# Patient Record
Sex: Female | Born: 1970
Health system: Southern US, Community
[De-identification: ages and names within clinical notes are randomized; demographics above are authoritative.]

## PROBLEM LIST (undated history)

## (undated) DIAGNOSIS — F419 Anxiety disorder, unspecified: Secondary | ICD-10-CM

## (undated) DIAGNOSIS — R011 Cardiac murmur, unspecified: Secondary | ICD-10-CM

## (undated) DIAGNOSIS — E119 Type 2 diabetes mellitus without complications: Secondary | ICD-10-CM

## (undated) DIAGNOSIS — K219 Gastro-esophageal reflux disease without esophagitis: Secondary | ICD-10-CM

## (undated) DIAGNOSIS — I10 Essential (primary) hypertension: Secondary | ICD-10-CM

## (undated) HISTORY — DX: Anxiety disorder, unspecified: F41.9

## (undated) HISTORY — PX: OOPHORECTOMY: SHX86

## (undated) HISTORY — PX: TUBAL LIGATION: SHX77

## (undated) HISTORY — PX: CHOLECYSTECTOMY: SHX55

## (undated) HISTORY — DX: Cardiac murmur, unspecified: R01.1

## (undated) HISTORY — PX: REFRACTIVE SURGERY: SHX103

## (undated) HISTORY — PX: KNEE SURGERY: SHX244

## (undated) HISTORY — DX: Type 2 diabetes mellitus without complications: E11.9

## (undated) HISTORY — DX: Essential (primary) hypertension: I10

---

## 2015-04-01 LAB — HM PAP SMEAR: HM Pap smear: NEGATIVE

## 2017-08-07 ENCOUNTER — Encounter: Payer: Self-pay | Admitting: Osteopathic Medicine

## 2017-08-07 ENCOUNTER — Ambulatory Visit (INDEPENDENT_AMBULATORY_CARE_PROVIDER_SITE_OTHER): Payer: 59 | Admitting: Osteopathic Medicine

## 2017-08-07 VITALS — BP 147/92 | HR 77 | Temp 98.1°F | Wt 197.1 lb

## 2017-08-07 DIAGNOSIS — I10 Essential (primary) hypertension: Secondary | ICD-10-CM

## 2017-08-07 DIAGNOSIS — Z131 Encounter for screening for diabetes mellitus: Secondary | ICD-10-CM

## 2017-08-07 DIAGNOSIS — Z13220 Encounter for screening for lipoid disorders: Secondary | ICD-10-CM

## 2017-08-07 DIAGNOSIS — K219 Gastro-esophageal reflux disease without esophagitis: Secondary | ICD-10-CM | POA: Diagnosis not present

## 2017-08-07 DIAGNOSIS — M1990 Unspecified osteoarthritis, unspecified site: Secondary | ICD-10-CM

## 2017-08-07 NOTE — Progress Notes (Addendum)
HPI: Monica Mcdaniel is a 47 y.o. female who  has no past medical history on file.  she presents to [facility] today, 08/07/17,  for chief complaint of: New to establish care Concerned about BP and DM2 risk   Pleasant new patient here to establish care   HTN: elevated BP today, concerned about high BP outside the office as well. No CP/SOB. Home cuff not verified.    Past medical, surgical, social and family history reviewed:  There are no active problems to display for this patient.  Past Surgical History:  Procedure Laterality Date  . CHOLECYSTECTOMY    . OOPHORECTOMY    . REFRACTIVE SURGERY     Social History   Tobacco Use  . Smoking status: Former Smoker    Packs/day: 1.00    Years: 7.00    Pack years: 7.00    Last attempt to quit: 1997    Years since quitting: 22.4  . Smokeless tobacco: Former Engineer, waterUser  Substance Use Topics  . Alcohol use: Yes    Comment: 3-4/wk   Family History  Problem Relation Age of Onset  . High blood pressure Mother   . Diabetes Mother   . Lung cancer Mother   . Diabetes Maternal Grandmother   . Stroke Maternal Grandmother      Current medication list and allergy/intolerance information reviewed:    Current Outpatient Medications  Medication Sig Dispense Refill  . naproxen sodium (ALEVE) 220 MG tablet Take 220 mg by mouth.    . ranitidine (ZANTAC) 150 MG tablet Take 150 mg by mouth 2 (two) times daily.     No current facility-administered medications for this visit.     No Known Allergies    Review of Systems:  Constitutional:  No  fever, no chills, No recent illness, No unintentional weight changes. No significant fatigue.   HEENT: No  headache, no vision change, no hearing change, No sore throat, No  sinus pressure  Cardiac: No  chest pain, No  pressure, No palpitations, No  Orthopnea  Respiratory:  No  shortness of breath. No  Cough  Gastrointestinal: No  abdominal pain, No  nausea, No   vomiting,  No  blood in stool, No  diarrhea, No  constipation   Musculoskeletal: No new myalgia/arthralgia  Skin: No  Rash, No other wounds/concerning lesions  Genitourinary: No  incontinence, No  abnormal genital bleeding, No abnormal genital discharge  Hem/Onc: No  easy bruising/bleeding, No  abnormal lymph node  Endocrine: No cold intolerance,  No heat intolerance. No polyuria/polydipsia/polyphagia   Neurologic: No  weakness, No  dizziness, No  slurred speech/focal weakness/facial droop  Psychiatric: No  concerns with depression, No  concerns with anxiety, No sleep problems, No mood problems  Exam:  BP (!) 147/92 (BP Location: Left Arm, Patient Position: Sitting, Cuff Size: Large)   Pulse 77   Temp 98.1 F (36.7 C) (Oral)   Wt 197 lb 1.6 oz (89.4 kg)   LMP 07/29/2017   Constitutional: VS see above. General Appearance: alert, well-developed, well-nourished, NAD  Eyes: Normal lids and conjunctive, non-icteric sclera  Ears, Nose, Mouth, Throat: MMM, Normal external inspection ears/nares/mouth/lips/gums. TM normal bilaterally. Pharynx/tonsils no erythema, no exudate. Nasal mucosa normal.   Neck: No masses, trachea midline. No thyroid enlargement. No tenderness/mass appreciated. No lymphadenopathy  Respiratory: Normal respiratory effort. no wheeze, no rhonchi, no rales  Cardiovascular: S1/S2 normal, no murmur, no rub/gallop auscultated. RRR. No lower extremity edema.  Gastrointestinal: Nontender, no masses.  No hepatomegaly, no splenomegaly. No hernia appreciated. Bowel sounds normal. Rectal exam deferred.   Musculoskeletal: Gait normal. No clubbing/cyanosis of digits.   Neurological: Normal balance/coordination. No tremor.   Skin: warm, dry, intact.  Psychiatric: Normal judgment/insight. Normal mood and affect. Oriented x3.     ASSESSMENT/PLAN:   Essential hypertension - Plan: CBC, COMPLETE METABOLIC PANEL WITH GFR, Lipid panel, TSH, Hemoglobin A1C w/out eAG, TEST  AUTHORIZATION, hydrochlorothiazide (HYDRODIURIL) 12.5 MG tablet  Lipid screening - Plan: Lipid panel  Diabetes mellitus screening - Plan: COMPLETE METABOLIC PANEL WITH GFR, Hemoglobin A1C w/out eAG, TEST AUTHORIZATION  Gastroesophageal reflux disease, esophagitis presence not specified - Plan: ranitidine (ZANTAC) 150 MG tablet  Arthritis - Plan: naproxen sodium (ALEVE) 220 MG tablet    Patient Instructions  Plan: Get labs done - fasting Will start BP meds based on lab results Recheck BP in the office 1-2 weeks or so after starting meds  Bring home BP monitor to the office at that visit       Visit summary with medication list and pertinent instructions was printed for patient to review. All questions at time of visit were answered - patient instructed to contact office with any additional concerns. ER/RTC precautions were reviewed with the patient.   Follow-up plan: Return for recheck blood pressure 1-2 weeks after starting medicine .   Please note: voice recognition software was used to produce this document, and typos may escape review. Please contact Dr. Lyn Hollingshead for any needed clarifications.

## 2017-08-07 NOTE — Patient Instructions (Addendum)
Plan: Get labs done - fasting Will start BP meds based on lab results Recheck BP in the office 1-2 weeks or so after starting meds  Bring home BP monitor to the office at that visit

## 2017-08-14 LAB — COMPLETE METABOLIC PANEL WITH GFR
AG Ratio: 1.5 (calc) (ref 1.0–2.5)
ALBUMIN MSPROF: 4.4 g/dL (ref 3.6–5.1)
ALKALINE PHOSPHATASE (APISO): 79 U/L (ref 33–115)
ALT: 26 U/L (ref 6–29)
AST: 21 U/L (ref 10–35)
BILIRUBIN TOTAL: 0.6 mg/dL (ref 0.2–1.2)
BUN: 11 mg/dL (ref 7–25)
CHLORIDE: 102 mmol/L (ref 98–110)
CO2: 26 mmol/L (ref 20–32)
Calcium: 9.7 mg/dL (ref 8.6–10.2)
Creat: 0.81 mg/dL (ref 0.50–1.10)
GFR, Est African American: 100 mL/min/{1.73_m2} (ref >=60)
GFR, Est Non African American: 86 mL/min/{1.73_m2} (ref >=60)
GLUCOSE: 105 mg/dL — AB (ref 65–99)
Globulin: 2.9 g/dL (calc) (ref 1.9–3.7)
Potassium: 4.7 mmol/L (ref 3.5–5.3)
SODIUM: 135 mmol/L (ref 135–146)
Total Protein: 7.3 g/dL (ref 6.1–8.1)

## 2017-08-14 LAB — TEST AUTHORIZATION

## 2017-08-14 LAB — TSH: TSH: 1.9 mIU/L

## 2017-08-14 LAB — LIPID PANEL
CHOLESTEROL: 159 mg/dL (ref <200)
HDL: 53 mg/dL (ref >50)
LDL Cholesterol (Calc): 91 mg/dL (calc)
Non-HDL Cholesterol (Calc): 106 mg/dL (calc) (ref <130)
Total CHOL/HDL Ratio: 3 (calc) (ref <5.0)
Triglycerides: 67 mg/dL (ref <150)

## 2017-08-14 LAB — CBC
HCT: 41.1 % (ref 35.0–45.0)
HEMOGLOBIN: 13.3 g/dL (ref 11.7–15.5)
MCH: 27.7 pg (ref 27.0–33.0)
MCHC: 32.4 g/dL (ref 32.0–36.0)
MCV: 85.6 fL (ref 80.0–100.0)
MPV: 10.3 fL (ref 7.5–12.5)
Platelets: 248 10*3/uL (ref 140–400)
RBC: 4.8 10*6/uL (ref 3.80–5.10)
RDW: 12.8 % (ref 11.0–15.0)
WBC: 5.3 10*3/uL (ref 3.8–10.8)

## 2017-08-14 LAB — HEMOGLOBIN A1C W/OUT EAG: HEMOGLOBIN A1C: 5 %{Hb} (ref <5.7)

## 2017-08-15 MED ORDER — HYDROCHLOROTHIAZIDE 12.5 MG PO TABS: 12.5000 mg | ORAL_TABLET | Freq: Every day | ORAL | 0 refills | Status: DC

## 2017-08-15 NOTE — Addendum Note (Signed)
Addended by: Deirdre PippinsALEXANDER, Kentrell Guettler M on: 08/15/2017 09:27 AM   Modules accepted: Orders

## 2017-08-24 ENCOUNTER — Telehealth: Payer: Self-pay | Admitting: Osteopathic Medicine

## 2017-08-24 NOTE — Telephone Encounter (Signed)
Good morning,   Patient called this morning to cancel her appt. on the 15th. She wanted to reschedule, however, with you being booked, I was unable to find a time that fit with her schedule as well. I was wondering if she could be scheduled a nurse visit while you were here in the office?

## 2017-08-24 NOTE — Telephone Encounter (Signed)
That's fine - be sure to remind her to bring home BP monitor to that visit w/ the nurse

## 2017-08-28 ENCOUNTER — Ambulatory Visit: Payer: Self-pay | Admitting: Osteopathic Medicine

## 2017-08-31 ENCOUNTER — Ambulatory Visit (INDEPENDENT_AMBULATORY_CARE_PROVIDER_SITE_OTHER): Payer: BLUE CROSS/BLUE SHIELD | Admitting: Physician Assistant

## 2017-08-31 VITALS — BP 139/71 | HR 74

## 2017-08-31 DIAGNOSIS — I10 Essential (primary) hypertension: Secondary | ICD-10-CM | POA: Diagnosis not present

## 2017-08-31 NOTE — Progress Notes (Signed)
Pt came into clinic today for BP check, she also brought her home machine. Pt reports taking her Rx's every am, but she has only been doing her home BP checks in the evening after getting off work. Our reading today was 139/71 (pulse 74), her home machine read 134/77 (pulse 77). It was very accurate.   Some of her home BP readings (pulse in parenthesis) were: 132/80 (74) - today 134/77 (77) 127/77 (69) 137/86 (76) 141/85 (82) 134/88 (86) 148/90 (81) - 08/19/17  Went over readings with Provider in office. Advised to keep medications the same, and start checking her BP in the AM/PM. BP log sheet provided. Pt will send readings in 2 weeks via MyChart for Provider review. No further questions/concerns.

## 2017-09-14 ENCOUNTER — Encounter: Payer: Self-pay | Admitting: Osteopathic Medicine

## 2017-09-14 DIAGNOSIS — I10 Essential (primary) hypertension: Secondary | ICD-10-CM

## 2017-09-15 MED ORDER — HYDROCHLOROTHIAZIDE 12.5 MG PO TABS: 25.0000 mg | ORAL_TABLET | Freq: Every day | ORAL | 0 refills | Status: DC

## 2017-10-10 ENCOUNTER — Encounter: Payer: Self-pay | Admitting: Osteopathic Medicine

## 2017-10-10 DIAGNOSIS — I10 Essential (primary) hypertension: Secondary | ICD-10-CM | POA: Insufficient documentation

## 2017-10-17 ENCOUNTER — Encounter: Payer: Self-pay | Admitting: Osteopathic Medicine

## 2017-10-17 DIAGNOSIS — I10 Essential (primary) hypertension: Secondary | ICD-10-CM

## 2017-10-18 MED ORDER — BENAZEPRIL HCL 10 MG PO TABS: 10.0000 mg | ORAL_TABLET | Freq: Every day | ORAL | 1 refills | Status: DC

## 2017-10-18 MED ORDER — HYDROCHLOROTHIAZIDE 25 MG PO TABS: 25.0000 mg | ORAL_TABLET | Freq: Every day | ORAL | 1 refills | Status: DC

## 2017-10-19 ENCOUNTER — Encounter: Payer: Self-pay | Admitting: Osteopathic Medicine

## 2017-10-31 ENCOUNTER — Encounter: Payer: Self-pay | Admitting: Osteopathic Medicine

## 2017-10-31 ENCOUNTER — Ambulatory Visit: Payer: BLUE CROSS/BLUE SHIELD | Admitting: Osteopathic Medicine

## 2017-10-31 DIAGNOSIS — I10 Essential (primary) hypertension: Secondary | ICD-10-CM

## 2017-10-31 MED ORDER — HYDROCHLOROTHIAZIDE 25 MG PO TABS: 25.0000 mg | ORAL_TABLET | Freq: Every day | ORAL | 1 refills | Status: DC

## 2017-10-31 MED ORDER — BENAZEPRIL HCL 10 MG PO TABS: 10.0000 mg | ORAL_TABLET | Freq: Every day | ORAL | 1 refills | Status: DC

## 2017-10-31 NOTE — Progress Notes (Signed)
HPI: Monica Mcdaniel is a 47 y.o. female who  has no past medical history on file.  she presents to [facility] today, 10/31/17,  for chief complaint of:  Blood Pressure follow-up  We initiated hydrochlorothiazide 12.5 mg 08/07/2017.  Patient had her home blood pressure cuff verified in the office 08/31/2017.  We subsequently increased HCTZ 25 mg daily, this was still not really controlling blood pressure so added benazepril on 10/17/2017 advised patient to follow-up in the office.  She is here today for blood pressure recheck.  Home readings:  Systolic: 110s-120s, 140 occ Diastolic: 60s-80s    Past medical history, surgical history, and family history reviewed.  Current medication list and allergy/intolerance information reviewed.   (See remainder of HPI, ROS, Phys Exam below)        ASSESSMENT/PLAN:   Essential hypertension - Plan: hydrochlorothiazide (HYDRODIURIL) 25 MG tablet, BASIC METABOLIC PANEL WITH GFR   Offered change meds d/t cough, pt not bothered by this and ok to continue meds, advised call me if needed and we can change!   Meds ordered this encounter  Medications  . benazepril (LOTENSIN) 10 MG tablet    Sig: Take 1 tablet (10 mg total) by mouth daily.    Dispense:  90 tablet    Refill:  1  . hydrochlorothiazide (HYDRODIURIL) 25 MG tablet    Sig: Take 1 tablet (25 mg total) by mouth daily.    Dispense:  90 tablet    Refill:  1     Follow-up plan: Return in about 6 months (around 05/01/2018) for BP check/annual wellness physical, sooner if needed.          ############################################ ############################################ ############################################ ############################################    Outpatient Encounter Medications as of 10/31/2017  Medication Sig  . benazepril (LOTENSIN) 10 MG tablet Take 1 tablet (10 mg total) by mouth daily.  . hydrochlorothiazide  (HYDRODIURIL) 25 MG tablet Take 1 tablet (25 mg total) by mouth daily.  . naproxen sodium (ALEVE) 220 MG tablet Take 220 mg by mouth.  . ranitidine (ZANTAC) 150 MG tablet Take 150 mg by mouth 2 (two) times daily.   No facility-administered encounter medications on file as of 10/31/2017.    No Known Allergies    Review of Systems:  Constitutional: No recent illness  HEENT: No  headache, no vision change  Cardiac: No  chest pain, No  pressure, No palpitations  Respiratory:  No  shortness of breath. +occasional nonbothersome Cough  Gastrointestinal: No  abdominal pain  Musculoskeletal: No new myalgia/arthralgia  Neurologic: No  weakness, No  Dizziness  Psychiatric: No  concerns with depression, +concerns with anxiety - work stress  Exam:  BP 139/73 (BP Location: Left Arm, Patient Position: Sitting, Cuff Size: Normal)   Pulse 87   Temp 98.4 F (36.9 C) (Oral)   Wt 180 lb 14.4 oz (82.1 kg)   Constitutional: VS see above. General Appearance: alert, well-developed, well-nourished, NAD  Eyes: Normal lids and conjunctive, non-icteric sclera  Ears, Nose, Mouth, Throat: MMM, Normal external inspection ears/nares/mouth/lips/gums.  Neck: No masses, trachea midline.   Respiratory: Normal respiratory effort. no wheeze, no rhonchi, no rales  Cardiovascular: S1/S2 normal, no murmur, no rub/gallop auscultated. RRR.   Musculoskeletal: Gait normal. Symmetric and independent movement of all extremities  Neurological: Normal balance/coordination. No tremor.  Skin: warm, dry, intact.   Psychiatric: Normal judgment/insight. Normal mood and affect. Oriented x3.   Visit summary with medication list and pertinent instructions was printed for patient to  review, advised to alert [place] if any changes needed. All questions at time of visit were answered - patient instructed to contact office with any additional concerns. ER/RTC precautions were reviewed with the patient and understanding  verbalized.   Follow-up plan: Return in about 6 months (around 05/01/2018) for BP check/annual wellness physical, sooner if needed.    Please note: voice recognition software was used to produce this document, and typos may escape review. Please contact Dr. Lyn Hollingshead for any needed clarifications.

## 2017-11-07 ENCOUNTER — Other Ambulatory Visit: Payer: Self-pay | Admitting: Osteopathic Medicine

## 2017-11-07 DIAGNOSIS — I10 Essential (primary) hypertension: Secondary | ICD-10-CM

## 2017-11-07 LAB — BASIC METABOLIC PANEL WITH GFR
BUN: 13 mg/dL (ref 7–25)
CO2: 26 mmol/L (ref 20–32)
CREATININE: 0.85 mg/dL (ref 0.50–1.10)
Calcium: 9.9 mg/dL (ref 8.6–10.2)
Chloride: 101 mmol/L (ref 98–110)
GFR, EST AFRICAN AMERICAN: 95 mL/min/{1.73_m2} (ref >=60)
GFR, Est Non African American: 82 mL/min/{1.73_m2} (ref >=60)
GLUCOSE: 121 mg/dL — AB (ref 65–99)
Potassium: 4.1 mmol/L (ref 3.5–5.3)
SODIUM: 137 mmol/L (ref 135–146)

## 2017-11-08 ENCOUNTER — Encounter: Payer: Self-pay | Admitting: Physician Assistant

## 2017-11-08 DIAGNOSIS — R739 Hyperglycemia, unspecified: Secondary | ICD-10-CM | POA: Insufficient documentation

## 2018-02-11 ENCOUNTER — Emergency Department
Admission: EM | Admit: 2018-02-11 | Discharge: 2018-02-11 | Disposition: A | Discharge status: Home / Self Care | Payer: BLUE CROSS/BLUE SHIELD | Source: Home / Self Care | Attending: Family Medicine | Admitting: Family Medicine

## 2018-02-11 ENCOUNTER — Emergency Department (INDEPENDENT_AMBULATORY_CARE_PROVIDER_SITE_OTHER): Payer: BLUE CROSS/BLUE SHIELD

## 2018-02-11 DIAGNOSIS — R05 Cough: Secondary | ICD-10-CM | POA: Diagnosis not present

## 2018-02-11 DIAGNOSIS — R053 Chronic cough: Secondary | ICD-10-CM

## 2018-02-11 MED ORDER — PREDNISONE 20 MG PO TABS: ORAL_TABLET | ORAL | 0 refills | Status: DC

## 2018-02-11 MED ORDER — AZITHROMYCIN 250 MG PO TABS: ORAL_TABLET | ORAL | 0 refills | Status: DC

## 2018-02-11 NOTE — Discharge Instructions (Addendum)
Take plain guaifenesin (1200mg extended release tabs such as Mucinex) twice daily, with plenty of water, for cough and congestion.   Get adequate rest.   °May use Afrin nasal spray (or generic oxymetazoline) each morning for about 5 days and then discontinue.  Also recommend using saline nasal spray several times daily and saline nasal irrigation (AYR is a common brand).  Use Flonase nasal spray each morning after using Afrin nasal spray and saline nasal irrigation. °Try warm salt water gargles for sore throat.  °Stop all antihistamines for now, and other non-prescription cough/cold preparations. °May take Delsym Cough Suppressant at bedtime for nighttime cough.  °  °

## 2018-02-11 NOTE — ED Provider Notes (Signed)
[facility]    CSN: [id number] Arrival date & time: 02/11/18  1350     History   Chief Complaint Chief Complaint  Patient presents with  . Cough    HPI Monica Mcdaniel is a 47 y.o. female.   Patient reports that she developed a cold-like illness about 3.5 weeks ago, including sinus congestion and cough, although she does not feel ill and denies fevers, chills, and sweats.  She was started on Lotensin 3 months ago, and shortly afterwards developed a very mild cough that has persisted.     Past medical history negative  Patient Active Problem List   Diagnosis Date Noted  . Hyperglycemia 11/08/2017  . Hypertension 10/10/2017    Past Surgical History:  Procedure Laterality Date  . CHOLECYSTECTOMY    . OOPHORECTOMY    . REFRACTIVE SURGERY      OB History   No obstetric history on file.      Home Medications    Prior to Admission medications   Medication Sig Start Date End Date Taking? Authorizing Provider  benazepril (LOTENSIN) 10 MG tablet Take 1 tablet (10 mg total) by mouth daily. 10/31/17  Yes Sunnie NielsenAlexander, Natalie, DO  famotidine (PEPCID) 20 MG tablet Take 20 mg by mouth 2 (two) times daily.   Yes [provider]  hydrochlorothiazide (HYDRODIURIL) 25 MG tablet Take 1 tablet (25 mg total) by mouth daily. 10/31/17  Yes Sunnie NielsenAlexander, Natalie, DO  azithromycin (ZITHROMAX Z-PAK) 250 MG tablet Take 2 tabs today; then begin one tab once daily for 4 more days. 02/11/18   Lattie HawBeese, Stephen A, MD  naproxen sodium (ALEVE) 220 MG tablet Take 220 mg by mouth.    [provider]  predniSONE (DELTASONE) 20 MG tablet Take one tab by mouth twice daily for 4 days, then one daily. Take with food. 02/11/18   Lattie HawBeese, Stephen A, MD  ranitidine (ZANTAC) 150 MG tablet Take 150 mg by mouth 2 (two) times daily.    [provider]    Family History Family History  Problem Relation Age of Onset  . High blood pressure Mother   . Diabetes Mother   . Lung cancer  Mother   . Diabetes Maternal Grandmother   . Stroke Maternal Grandmother     Social History Social History   Tobacco Use  . Smoking status: Former Smoker    Packs/day: 1.00    Years: 7.00    Pack years: 7.00    Last attempt to quit: 1997    Years since quitting: 23.0  . Smokeless tobacco: Former Engineer, waterUser  Substance Use Topics  . Alcohol use: Yes    Comment: 3-4/wk  . Drug use: Never     Allergies   Patient has no known allergies.   Review of Systems Review of Systems + sore throat, resolved + cough No pleuritic pain No wheezing + nasal congestion + post-nasal drainage No sinus pain/pressure No itchy/red eyes No earache No hemoptysis No SOB No fever/chills No nausea No vomiting No abdominal pain No diarrhea No urinary symptoms No skin rash No fatigue No myalgias No headache    Physical Exam Triage Vital Signs ED Triage Vitals  Enc Vitals Group     BP 02/11/18 1419 (!) 145/92     Pulse --      Resp 02/11/18 1419 17     Temp 02/11/18 1419 98.4 F (36.9 C)     Temp Source 02/11/18 1419 Oral     SpO2 02/11/18 1419 99 %  Weight 02/11/18 1420 186 lb (84.4 kg)     Height 02/11/18 1420 5\' 1"  (1.549 m)     Head Circumference --      Peak Flow --      Pain Score 02/11/18 1420 0     Pain Loc --      Pain Edu? --      Excl. in GC? --    No data found.  Updated Vital Signs BP (!) 145/92 (BP Location: Right Arm)   Temp 98.4 F (36.9 C) (Oral)   Resp 17   Ht 5\' 1"  (1.549 m)   Wt 84.4 kg   LMP 02/11/2018 (Exact Date)   SpO2 99%   BMI 35.14 kg/m   Visual Acuity Right Eye Distance:   Left Eye Distance:   Bilateral Distance:    Right Eye Near:   Left Eye Near:    Bilateral Near:     Physical Exam Nursing notes and Vital Signs reviewed. Appearance:  Patient appears stated age, and in no acute distress Eyes:  Pupils are equal, round, and reactive to light and accomodation.  Extraocular movement is intact.  Conjunctivae are not inflamed    Ears:  Canals normal.  Tympanic membranes normal.  Nose:  Mildly congested turbinates.  No sinus tenderness.   Pharynx:  Normal Neck:  Supple.  Enlarged nontender posterior/lateral nodes are palpated bilaterally.  Lungs:  Clear to auscultation.  Breath sounds are equal.  Moving air well Heart:  Regular rate and rhythm without murmurs, rubs, or gallops.  Abdomen:  Nontender without masses or hepatosplenomegaly.  Bowel sounds are present.  No CVA or flank tenderness.  Extremities:  No edema.  Skin:  No rash present.    UC Treatments / Results  Labs (all labs ordered are listed, but only abnormal results are displayed) Labs Reviewed - No data to display  EKG None  Radiology Dg Chest 2 View  Result Date: 02/11/2018 CLINICAL DATA:  Persistent cough for 1 month. EXAM: CHEST - 2 VIEW COMPARISON:  None. FINDINGS: The heart size and mediastinal contours are within normal limits. Both lungs are clear. The visualized skeletal structures are unremarkable. IMPRESSION: Normal exam. Electronically Signed   By: Francene BoyersJames  Maxwell M.D.   On: 02/11/2018 15:37    Procedures Procedures (including critical care time)  Medications Ordered in UC Medications - No data to display  Initial Impression / Assessment and Plan / UC Course  I have reviewed the triage vital signs and the nursing notes.  Pertinent labs & imaging results that were available during my care of the patient were reviewed by me and considered in my medical decision making (see chart for details).    ?bronchitis, superimposed on ?ACEI cough. Begin empiric Z-pack and prednisone burst/taper. Followup with Family Doctor if not improved in about two weeks (if cough persists, may need to switch to ARB)   Final Clinical Impressions(s) / UC Diagnoses   Final diagnoses:  Persistent cough for 3 weeks or longer     Discharge Instructions     Take plain guaifenesin (1200mg  extended release tabs such as Mucinex) twice daily, with plenty  of water, for cough and congestion.   Get adequate rest.   May use Afrin nasal spray (or generic oxymetazoline) each morning for about 5 days and then discontinue.  Also recommend using saline nasal spray several times daily and saline nasal irrigation (AYR is a common brand).  Use Flonase nasal spray each morning after using Afrin nasal spray and  saline nasal irrigation. Try warm salt water gargles for sore throat.  Stop all antihistamines for now, and other non-prescription cough/cold preparations. May take Delsym Cough Suppressant at bedtime for nighttime cough.         ED Prescriptions    Medication Sig Dispense Auth. Provider   predniSONE (DELTASONE) 20 MG tablet Take one tab by mouth twice daily for 4 days, then one daily. Take with food. 12 tablet Lattie Haw, MD   azithromycin (ZITHROMAX Z-PAK) 250 MG tablet Take 2 tabs today; then begin one tab once daily for 4 more days. 6 tablet Lattie Haw, MD        Lattie Haw, MD 02/16/18 [id number]

## 2018-02-11 NOTE — ED Triage Notes (Signed)
Any complains of a cough for 3 1/2 weeks. Cough has become productive in the morning over the last week with green sputum. She also has notice more congestion and runny nose with green drainage. Denies fever, chills or sweats

## 2018-03-12 ENCOUNTER — Ambulatory Visit: Payer: BLUE CROSS/BLUE SHIELD | Admitting: Osteopathic Medicine

## 2018-03-12 ENCOUNTER — Encounter: Payer: Self-pay | Admitting: Osteopathic Medicine

## 2018-03-12 VITALS — BP 122/80 | HR 87 | Temp 98.1°F | Wt 189.8 lb

## 2018-03-12 DIAGNOSIS — R05 Cough: Secondary | ICD-10-CM

## 2018-03-12 DIAGNOSIS — R053 Chronic cough: Secondary | ICD-10-CM

## 2018-03-12 MED ORDER — PREDNISONE 10 MG (48) PO TBPK: ORAL_TABLET | Freq: Every day | ORAL | 0 refills | Status: DC

## 2018-03-12 MED ORDER — BUDESONIDE-FORMOTEROL FUMARATE 160-4.5 MCG/ACT IN AERO: 2.0000 | INHALATION_SPRAY | Freq: Two times a day (BID) | RESPIRATORY_TRACT | 3 refills | Status: DC

## 2018-03-12 MED ORDER — HYDROCODONE-HOMATROPINE 5-1.5 MG/5ML PO SYRP: 5.0000 mL | ORAL_SOLUTION | ORAL | 0 refills | Status: DC | PRN

## 2018-03-12 MED ORDER — BENZONATATE 200 MG PO CAPS: 200.0000 mg | ORAL_CAPSULE | Freq: Three times a day (TID) | ORAL | 0 refills | Status: DC | PRN

## 2018-03-12 NOTE — Patient Instructions (Addendum)
Plan:  Will try the following for cough.. Inhaler (symbicort) Steroids (prednisone, longer taper this time) Daytime cough medicine (benzonatate aka Tessalon) Cough Syrup at nighttime [facility])   Consider also... Pepcid every night for a couple weeks Adding allergy medicine: Claritin or Zyrtec or Allegra pills +/- Flonase or Nasonex nasal sprays

## 2018-03-12 NOTE — Progress Notes (Signed)
HPI: Monica Mcdaniel is a 49 y.o. female who  has no past medical history on file.  she presents to [facility] today, 03/12/18,  for chief complaint of: Cough  . Context: started after viral cold, got a bit better then cough has just persisted, went to urgent care CXR ok, doesn't feel like cough she previously had w/ ACE Rx.  . Location: chest . Quality: congestion, cough . Duration: 2+ mos . Timing: constant  . Modifying factors: no home meds at this point, steroid burst helped . Assoc signs/symptoms: mild nasal congestion w/ clear mucus        Past medical, surgical, social and family history reviewed and updated as necessary.   Current medication list and allergy/intolerance information reviewed:    Current Meds  Medication Sig  . benazepril (LOTENSIN) 10 MG tablet Take 1 tablet (10 mg total) by mouth daily.  . famotidine (PEPCID) 20 MG tablet Take 20 mg by mouth 2 (two) times daily.  . hydrochlorothiazide (HYDRODIURIL) 25 MG tablet Take 1 tablet (25 mg total) by mouth daily.  . naproxen sodium (ALEVE) 220 MG tablet Take 220 mg by mouth.  . ranitidine (ZANTAC) 150 MG tablet Take 150 mg by mouth 2 (two) times daily.  . [DISCONTINUED] predniSONE (DELTASONE) 20 MG tablet Take one tab by mouth twice daily for 4 days, then one daily. Take with food.     No Known Allergies    Review of Systems:  Constitutional:  No  fever, no chills, No recent illness, No unintentional weight changes. No significant fatigue.   HEENT: No  headache, no vision change, no hearing change, No sore throat, No  sinus pressure  Cardiac: No  chest pain, No  pressure, No palpitations  Respiratory:  No  shortness of breath. +Cough  Gastrointestinal: No  abdominal pain, No  nausea, No  vomiting,  No  blood in stool, No  diarrhea  Musculoskeletal: No new myalgia/arthralgia  Skin: No  Rash  Neurologic: No  weakness, No  dizziness  Exam:  BP 122/80 (BP Location:  Left Arm, Patient Position: Sitting, Cuff Size: Normal)   Pulse 87   Temp 98.1 F (36.7 C) (Oral)   Wt 189 lb 12.8 oz (86.1 kg)   LMP 02/11/2018 (Exact Date)   SpO2 100%   BMI 35.86 kg/m   Constitutional: VS see above. General Appearance: alert, well-developed, well-nourished, NAD  Eyes: Normal lids and conjunctive, non-icteric sclera  Ears, Nose, Mouth, Throat: MMM, Normal external inspection ears/nares/mouth/lips/gums. TM normal bilaterally. Pharynx/tonsils no erythema, no exudate. Nasal mucosa normal.   Neck: No masses, trachea midline. No tenderness/mass appreciated. No lymphadenopathy  Respiratory: Normal respiratory effort. no wheeze, no rhonchi, no rales  Cardiovascular: S1/S2 normal, no murmur, no rub/gallop auscultated. RRR. No lower extremity edema.   Gastrointestinal: Nontender, no masses. Bowel sounds normal.  Musculoskeletal: Gait normal.   Neurological: Normal balance/coordination. No tremor.   Skin: warm, dry, intact.   Psychiatric: Normal judgment/insight. Normal mood and affect.      ASSESSMENT/PLAN: The encounter diagnosis was Persistent cough.  Meds ordered this encounter  Medications  . predniSONE (STERAPRED UNI-PAK 48 TAB) 10 MG (48) TBPK tablet    Sig: Take by mouth daily. 12-Day taper, po    Dispense:  48 tablet    Refill:  0  . benzonatate (TESSALON) 200 MG capsule    Sig: Take 1 capsule (200 mg total) by mouth 3 (three) times daily as needed for cough.  Dispense:  30 capsule    Refill:  0  . HYDROcodone-homatropine (HYCODAN) 5-1.5 MG/5ML syrup    Sig: Take 5 mLs by mouth every 4 (four) hours as needed for cough.    Dispense:  120 mL    Refill:  0  . budesonide-formoterol (SYMBICORT) 160-4.5 MCG/ACT inhaler    Sig: Inhale 2 puffs into the lungs 2 (two) times daily.    Dispense:  1 Inhaler    Refill:  3    No orders of the defined types were placed in this encounter.   Patient Instructions  Plan:  Will try the following for  cough.. Inhaler (symbicort) Steroids (prednisone, longer taper this time) Daytime cough medicine (benzonatate aka Tessalon) Cough Syrup at nighttime [facility])   Consider also... Pepcid every night for a couple weeks Adding allergy medicine: Claritin or Zyrtec or Allegra pills +/- Flonase or Nasonex nasal sprays             Visit summary with medication list and pertinent instructions was printed for patient to review. All questions at time of visit were answered - patient instructed to contact office with any additional concerns or updates. ER/RTC precautions were reviewed with the patient.   Follow-up plan: Return if symptoms worsen or fail to improve.    Please note: voice recognition software was used to produce this document, and typos may escape review. Please contact Dr. Lyn Hollingshead for any needed clarifications.

## 2018-05-01 ENCOUNTER — Ambulatory Visit (INDEPENDENT_AMBULATORY_CARE_PROVIDER_SITE_OTHER): Payer: BLUE CROSS/BLUE SHIELD | Admitting: Osteopathic Medicine

## 2018-05-01 ENCOUNTER — Other Ambulatory Visit: Payer: Self-pay

## 2018-05-01 ENCOUNTER — Other Ambulatory Visit: Payer: Self-pay | Admitting: Osteopathic Medicine

## 2018-05-01 ENCOUNTER — Encounter: Payer: Self-pay | Admitting: Osteopathic Medicine

## 2018-05-01 VITALS — BP 122/76 | HR 97 | Temp 98.5°F | Wt 189.3 lb

## 2018-05-01 DIAGNOSIS — K219 Gastro-esophageal reflux disease without esophagitis: Secondary | ICD-10-CM | POA: Diagnosis not present

## 2018-05-01 DIAGNOSIS — Z0000 Encounter for general adult medical examination without abnormal findings: Secondary | ICD-10-CM | POA: Diagnosis not present

## 2018-05-01 DIAGNOSIS — R05 Cough: Secondary | ICD-10-CM | POA: Diagnosis not present

## 2018-05-01 DIAGNOSIS — I10 Essential (primary) hypertension: Secondary | ICD-10-CM | POA: Diagnosis not present

## 2018-05-01 DIAGNOSIS — Z1239 Encounter for other screening for malignant neoplasm of breast: Secondary | ICD-10-CM | POA: Diagnosis not present

## 2018-05-01 DIAGNOSIS — R053 Chronic cough: Secondary | ICD-10-CM

## 2018-05-01 MED ORDER — PANTOPRAZOLE SODIUM 40 MG PO TBEC: 40.0000 mg | DELAYED_RELEASE_TABLET | Freq: Every day | ORAL | 0 refills | Status: DC

## 2018-05-01 NOTE — Patient Instructions (Addendum)
General Preventive Care  Most recent routine screening lipids/other labs: ordered today   Everyone should have blood pressure checked once per year.   Tobacco: don't!   Alcohol: responsible moderation is ok for most adults - if you have concerns about your alcohol intake, please talk to me!   Exercise: as tolerated to reduce risk of cardiovascular disease and diabetes. Strength training will also prevent osteoporosis.   Mental health: if need for mental health care (medicines, counseling, other), or concerns about moods, please let me know!   Sexual health: if ever need for STD testing, or if concerns with libido/pain problems, please let me know!   Advanced Directive: Living Will and/or Healthcare Power of Attorney recommended for all adults, regardless of age or health. See attached info! Vaccines  Flu vaccine: recommended for almost everyone, late fall/early winter.   Shingles vaccine: Shingrix recommended after age 46.  Pneumonia vaccines: Prevnar and Pneumovax recommended after age 65, or sooner if certain medical conditions.  Tetanus booster: Tdap recommended every 10 years. Due 09/2024 Cancer screenings   Colon cancer screening: recommended for everyone at age 51, but some folks need a colonoscopy sooner if risk factors.  Breast cancer screening: mammogram recommended at age 17 every other year at least, and annually after age 39. Mammogram ordered.   Cervical cancer screening: Pap every 1 to 5 years depending on age and other risk factors. Can usually stop at age 77 or w/ hysterectomy. Please ask your OBGYN to forward [place] records of your pap results!   Lung cancer screening: not needed w/ remote history of smoking  Infection screenings . HIV, Gonorrhea/Chlamydia: screening as needed . Hepatitis C: recommended for anyone born 55-1965 . TB: certain at-risk populations, or depending on work requirements and/or travel history Other . Bone Density Test: recommended for  women at age 36    COUGH:  I think cough is most likely due to acid reflux causing irritation of the airways, particularly with evening/nighttime cough, lying down flat.  Can continue Pepcid, I also sent a medication called pantoprazole to take for the next 6 to 8 weeks, then can try coming off of it and see how you are doing.  Can also treat allergies/postnasal drip with over-the-counter antihistamine such as Claritin, Allegra, Zyrtec plus over-the-counter nasal steroid spray such as Flonase, Nasonex.  If cough is not getting better with these measures, please let me know.  May need to send to GI or pulmonary specialist for further evaluation.       Please note: Preventive care issues were addressed today as part of your annual wellness physical, and this care should be covered under your insurance. However there were other medical issues which were also addressed today, and insurance may bill you separately for a "problem-based visit" for this care: cough, hypertension. Any questions or concerns about charges which may appear on your billing statement should be directed to your insurance company or to [facility] billing department, and they will contact our office if there are further concerns.

## 2018-05-01 NOTE — Telephone Encounter (Signed)
[facility] requesting pantoprazole be changed to omeprazole. Pls advise, thanks.

## 2018-05-01 NOTE — Telephone Encounter (Signed)
Noted  

## 2018-05-01 NOTE — Progress Notes (Signed)
HPI: Monica Mcdaniel is a 48 y.o. female who  has no past medical history on file.  she presents to [facility] today, 05/01/18,  for chief complaint of: Annual physical  Hypertension Cough    Patient here for annual physical / wellness exam.  See preventive care reviewed as below.    Additional concerns today include:   COUGH Persistent since December, worse at night  GERD Zantac changes to Pepcid which doesn't seem to be working as well  HTN home BP around 140 systolic consistently on confirmed monitor    BP Readings from Last 3 Encounters:  05/01/18 (!) 162/75 in intake Recheck: 122/76 Manual recheck 140/95  03/12/18 122/80  02/11/18 (!) 145/92     Past medical, surgical, social and family history reviewed:  Patient Active Problem List   Diagnosis Date Noted  . Hyperglycemia 11/08/2017  . Hypertension 10/10/2017    Past Surgical History:  Procedure Laterality Date  . CHOLECYSTECTOMY    . OOPHORECTOMY    . REFRACTIVE SURGERY    . TUBAL LIGATION      Social History   Tobacco Use  . Smoking status: Former Smoker    Packs/day: 1.00    Years: 7.00    Pack years: 7.00    Last attempt to quit: 1997    Years since quitting: 23.2  . Smokeless tobacco: Former Engineer, water Use Topics  . Alcohol use: Yes    Comment: 3-4/wk    Family History  Problem Relation Age of Onset  . High blood pressure Mother   . Diabetes Mother   . Lung cancer Mother   . Diabetes Maternal Grandmother   . Stroke Maternal Grandmother      Current medication list and allergy/intolerance information reviewed:    Current Outpatient Medications  Medication Sig Dispense Refill  . benazepril (LOTENSIN) 10 MG tablet Take 1 tablet (10 mg total) by mouth daily. 90 tablet 1  . famotidine (PEPCID) 20 MG tablet Take 20 mg by mouth 2 (two) times daily.    . hydrochlorothiazide (HYDRODIURIL) 25 MG tablet Take 1 tablet (25 mg total) by mouth daily. 90  tablet 1  . naproxen sodium (ALEVE) 220 MG tablet Take 220 mg by mouth.    . pantoprazole (PROTONIX) 40 MG tablet Take 1 tablet (40 mg total) by mouth daily. 30 tablet 0  . ranitidine (ZANTAC) 150 MG tablet Take 150 mg by mouth 2 (two) times daily.     No current facility-administered medications for this visit.     No Known Allergies    Review of Systems:  Constitutional:  No  fever, no chills, No recent illness, No unintentional weight changes. No significant fatigue.   HEENT: No  headache, no vision change, no hearing change, No sore throat, No  sinus pressure  Cardiac: No  chest pain, No  pressure, No palpitations, No  Orthopnea  Respiratory:  No  shortness of breath. +Cough  Gastrointestinal: No  abdominal pain except GERD, No  nausea, No  vomiting,  No  blood in stool, No  diarrhea, No  constipation   Musculoskeletal: No new myalgia/arthralgia  Skin: No  Rash, No other wounds/concerning lesions  Genitourinary: No  incontinence, No  abnormal genital bleeding, No abnormal genital discharge  Hem/Onc: No  easy bruising/bleeding, No  abnormal lymph node  Endocrine: No cold intolerance,  No heat intolerance. No polyuria/polydipsia/polyphagia   Neurologic: No  weakness, No  dizziness  Psychiatric: No  concerns with depression,  No  concerns with anxiety, No sleep problems, No mood problems  Exam:  BP 122/76 (BP Location: Left Arm, Patient Position: Sitting, Cuff Size: Normal)   Pulse 97   Temp 98.5 F (36.9 C) (Oral)   Wt 189 lb 4.8 oz (85.9 kg)   BMI 35.77 kg/m   Constitutional: VS see above. General Appearance: alert, well-developed, well-nourished, NAD  Eyes: Normal lids and conjunctive, non-icteric sclera  Ears, Nose, Mouth, Throat: MMM, Normal external inspection ears/nares/mouth/lips/gums. TM normal bilaterally. Pharynx/tonsils no erythema, no exudate. Nasal mucosa normal.   Neck: No masses, trachea midline. No thyroid enlargement. No tenderness/mass  appreciated. No lymphadenopathy  Respiratory: Normal respiratory effort. no wheeze, no rhonchi, no rales  Cardiovascular: S1/S2 normal, no murmur, no rub/gallop auscultated. RRR. No lower extremity edema.   Gastrointestinal: Nontender, no masses. No hepatomegaly, no splenomegaly. No hernia appreciated. Bowel sounds normal. Rectal exam deferred.   Musculoskeletal: Gait normal. No clubbing/cyanosis of digits.   Neurological: Normal balance/coordination. No tremor. No cranial nerve deficit on limited exam. Motor and sensation intact and symmetric. Cerebellar reflexes intact.   Skin: warm, dry, intact. No rash/ulcer. No concerning nevi or subq nodules on limited exam.    Psychiatric: Normal judgment/insight. Normal mood and affect. Oriented x3.      ASSESSMENT/PLAN: The primary encounter diagnosis was Annual physical exam. Diagnoses of Screening for breast cancer, Essential hypertension, Gastroesophageal reflux disease, esophagitis presence not specified, and Persistent cough were also pertinent to this visit.   Orders Placed This Encounter  Procedures  . MM 3D SCREEN BREAST BILATERAL  . CBC  . COMPLETE METABOLIC PANEL WITH GFR  . Lipid panel  . TSH  . VITAMIN D 25 Hydroxy (Vit-D Deficiency, Fractures)    Meds ordered this encounter  Medications  . pantoprazole (PROTONIX) 40 MG tablet    Sig: Take 1 tablet (40 mg total) by mouth daily.    Dispense:  30 tablet    Refill:  0    Patient Instructions  General Preventive Care  Most recent routine screening lipids/other labs: ordered today   Everyone should have blood pressure checked once per year.   Tobacco: don't!   Alcohol: responsible moderation is ok for most adults - if you have concerns about your alcohol intake, please talk to me!   Exercise: as tolerated to reduce risk of cardiovascular disease and diabetes. Strength training will also prevent osteoporosis.   Mental health: if need for mental health care  (medicines, counseling, other), or concerns about moods, please let me know!   Sexual health: if ever need for STD testing, or if concerns with libido/pain problems, please let me know!   Advanced Directive: Living Will and/or Healthcare Power of Attorney recommended for all adults, regardless of age or health. See attached info! Vaccines  Flu vaccine: recommended for almost everyone, late fall/early winter.   Shingles vaccine: Shingrix recommended after age 61.  Pneumonia vaccines: Prevnar and Pneumovax recommended after age 26, or sooner if certain medical conditions.  Tetanus booster: Tdap recommended every 10 years. Due 09/2024 Cancer screenings   Colon cancer screening: recommended for everyone at age 78, but some folks need a colonoscopy sooner if risk factors.  Breast cancer screening: mammogram recommended at age 67 every other year at least, and annually after age 67. Mammogram ordered.   Cervical cancer screening: Pap every 1 to 5 years depending on age and other risk factors. Can usually stop at age 67 or w/ hysterectomy. Please ask your OBGYN to forward [place] records  of your pap results!   Lung cancer screening: not needed w/ remote history of smoking  Infection screenings . HIV, Gonorrhea/Chlamydia: screening as needed . Hepatitis C: recommended for anyone born [id number] . TB: certain at-risk populations, or depending on work requirements and/or travel history Other . Bone Density Test: recommended for women at age 365    COUGH:  I think cough is most likely due to acid reflux causing irritation of the airways, particularly with evening/nighttime cough, lying down flat.  Can continue Pepcid, I also sent a medication called pantoprazole to take for the next 6 to 8 weeks, then can try coming off of it and see how you are doing.  Can also treat allergies/postnasal drip with over-the-counter antihistamine such as Claritin, Allegra, Zyrtec plus over-the-counter nasal steroid  spray such as Flonase, Nasonex.  If cough is not getting better with these measures, please let me know.  May need to send to GI or pulmonary specialist for further evaluation.       Please note: Preventive care issues were addressed today as part of your annual wellness physical, and this care should be covered under your insurance. However there were other medical issues which were also addressed today, and insurance may bill you separately for a "problem-based visit" for this care: cough, hypertension. Any questions or concerns about charges which may appear on your billing statement should be directed to your insurance company or to [facility] billing department, and they will contact our office if there are further concerns.            Visit summary with medication list and pertinent instructions was printed for patient to review. All questions at time of visit were answered - patient instructed to contact office with any additional concerns or updates. ER/RTC precautions were reviewed with the patient.     Please note: voice recognition software was used to produce this document, and typos may escape review. Please contact Dr. Lyn HollingsheadAlexander for any needed clarifications.     Follow-up plan: Return for labs fasting, follow-up based on lab results and home BP monitors .

## 2018-05-03 LAB — COMPLETE METABOLIC PANEL WITH GFR
AG Ratio: 1.6 (calc) (ref 1.0–2.5)
ALT: 23 U/L (ref 6–29)
AST: 20 U/L (ref 10–35)
Albumin: 4.2 g/dL (ref 3.6–5.1)
Alkaline phosphatase (APISO): 72 U/L (ref 31–125)
BUN: 11 mg/dL (ref 7–25)
CHLORIDE: 100 mmol/L (ref 98–110)
CO2: 29 mmol/L (ref 20–32)
Calcium: 9.2 mg/dL (ref 8.6–10.2)
Creat: 0.84 mg/dL (ref 0.50–1.10)
GFR, Est African American: 96 mL/min/{1.73_m2} (ref >=60)
GFR, Est Non African American: 83 mL/min/{1.73_m2} (ref >=60)
Globulin: 2.6 g/dL (calc) (ref 1.9–3.7)
Glucose, Bld: 100 mg/dL — ABNORMAL HIGH (ref 65–99)
Potassium: 3.7 mmol/L (ref 3.5–5.3)
Sodium: 137 mmol/L (ref 135–146)
Total Bilirubin: 0.6 mg/dL (ref 0.2–1.2)
Total Protein: 6.8 g/dL (ref 6.1–8.1)

## 2018-05-03 LAB — LIPID PANEL
Cholesterol: 154 mg/dL (ref <200)
HDL: 58 mg/dL (ref >=50)
LDL Cholesterol (Calc): 77 mg/dL (calc)
Non-HDL Cholesterol (Calc): 96 mg/dL (calc) (ref <130)
Total CHOL/HDL Ratio: 2.7 (calc) (ref <5.0)
Triglycerides: 103 mg/dL (ref <150)

## 2018-05-03 LAB — CBC
HEMATOCRIT: 39 % (ref 35.0–45.0)
Hemoglobin: 12.9 g/dL (ref 11.7–15.5)
MCH: 28 pg (ref 27.0–33.0)
MCHC: 33.1 g/dL (ref 32.0–36.0)
MCV: 84.6 fL (ref 80.0–100.0)
MPV: 9.7 fL (ref 7.5–12.5)
Platelets: 376 10*3/uL (ref 140–400)
RBC: 4.61 10*6/uL (ref 3.80–5.10)
RDW: 14 % (ref 11.0–15.0)
WBC: 6.2 10*3/uL (ref 3.8–10.8)

## 2018-05-03 LAB — TSH: TSH: 1.8 mIU/L

## 2018-05-03 LAB — VITAMIN D 25 HYDROXY (VIT D DEFICIENCY, FRACTURES): Vit D, 25-Hydroxy: 14 ng/mL — ABNORMAL LOW (ref 30–100)

## 2018-05-03 MED ORDER — VITAMIN D (ERGOCALCIFEROL) 1.25 MG (50000 UNIT) PO CAPS: 50000.0000 [IU] | ORAL_CAPSULE | ORAL | 0 refills | Status: DC

## 2018-05-03 NOTE — Addendum Note (Signed)
Addended by: Deirdre Pippins on: 05/03/2018 10:19 AM   Modules accepted: Orders

## 2018-05-09 ENCOUNTER — Other Ambulatory Visit: Payer: Self-pay | Admitting: Osteopathic Medicine

## 2018-05-09 NOTE — Telephone Encounter (Signed)
Refill sent to [facility], sending to office

## 2018-06-06 ENCOUNTER — Ambulatory Visit: Payer: BLUE CROSS/BLUE SHIELD

## 2018-06-28 ENCOUNTER — Ambulatory Visit: Payer: BLUE CROSS/BLUE SHIELD

## 2018-07-04 ENCOUNTER — Ambulatory Visit: Payer: BLUE CROSS/BLUE SHIELD

## 2018-07-18 ENCOUNTER — Other Ambulatory Visit: Payer: Self-pay | Admitting: Osteopathic Medicine

## 2018-07-25 ENCOUNTER — Other Ambulatory Visit: Payer: Self-pay | Admitting: Osteopathic Medicine

## 2018-08-07 ENCOUNTER — Other Ambulatory Visit: Payer: Self-pay | Admitting: Osteopathic Medicine

## 2018-10-09 ENCOUNTER — Other Ambulatory Visit: Payer: Self-pay | Admitting: Osteopathic Medicine

## 2018-10-09 DIAGNOSIS — I10 Essential (primary) hypertension: Secondary | ICD-10-CM

## 2018-10-27 ENCOUNTER — Other Ambulatory Visit: Payer: Self-pay | Admitting: Osteopathic Medicine

## 2018-10-27 NOTE — Telephone Encounter (Signed)
Requested medication (s) are due for refill today: yes  Requested medication (s) are on the active medication list: yes  Last refill:  07/25/2018  Future visit scheduled: no  Notes to clinic:  Review for refill   Requested Prescriptions  Pending Prescriptions Disp Refills   omeprazole (PRILOSEC) 20 MG capsule [Pharmacy Med Name: OMEPRAZOLE DR 20 MG CAPSULE] 90 capsule 0    Sig: TAKE [place]is     Gastroenterology: Proton Pump Inhibitors Failed - 10/27/2018  8:42 AM      Failed - Valid encounter within last 12 months    Recent Outpatient Visits          5 months ago Annual physical exam   [facility], Lanelle Bal, DO   7 months ago Persistent cough   [facility], Lanelle Bal, DO   12 months ago Essential hypertension   [facility], Lanelle Bal, DO   1 year ago Essential hypertension   [place], Elson Areas, PA-C   1 year ago Essential hypertension   [place], [place], DO

## 2018-11-10 ENCOUNTER — Other Ambulatory Visit: Payer: Self-pay | Admitting: Osteopathic Medicine

## 2018-11-26 ENCOUNTER — Other Ambulatory Visit: Payer: Self-pay | Admitting: Family Medicine

## 2018-12-28 ENCOUNTER — Other Ambulatory Visit: Payer: Self-pay | Admitting: Family Medicine

## 2019-01-02 DIAGNOSIS — Z20828 Contact with and (suspected) exposure to other viral communicable diseases: Secondary | ICD-10-CM | POA: Diagnosis not present

## 2019-01-11 ENCOUNTER — Other Ambulatory Visit: Payer: Self-pay | Admitting: Osteopathic Medicine

## 2019-01-11 NOTE — Telephone Encounter (Signed)
Dr. Sheppard Coil is the PCP and sees patient at [facility] does not refill for this practice.  Will route to office.

## 2019-01-14 ENCOUNTER — Telehealth: Payer: Self-pay | Admitting: Osteopathic Medicine

## 2019-01-14 NOTE — Telephone Encounter (Signed)
Well, she needs appt with PCP, then we can refill  What was the question?

## 2019-01-14 NOTE — Telephone Encounter (Signed)
Can schedule a visit for follow-up blood pressure  Thanks

## 2019-01-14 NOTE — Telephone Encounter (Addendum)
Pt called. She states pharmacy gave her 2 wks refill on her Benazepril and told her that she needed an appointment to see her PCP. Please advise.  Thanks.

## 2019-01-14 NOTE — Telephone Encounter (Signed)
Patient has a follow up with PCP for BP check before she is out of her medication. No other questions at this time.

## 2019-01-15 NOTE — Telephone Encounter (Signed)
Thank you :)

## 2019-01-16 ENCOUNTER — Other Ambulatory Visit: Payer: Self-pay

## 2019-01-16 ENCOUNTER — Ambulatory Visit (INDEPENDENT_AMBULATORY_CARE_PROVIDER_SITE_OTHER): Payer: BC Managed Care – PPO | Admitting: Osteopathic Medicine

## 2019-01-16 ENCOUNTER — Encounter: Payer: Self-pay | Admitting: Osteopathic Medicine

## 2019-01-16 VITALS — BP 138/86 | HR 102 | Temp 98.2°F | Wt 196.0 lb

## 2019-01-16 DIAGNOSIS — I10 Essential (primary) hypertension: Secondary | ICD-10-CM

## 2019-01-16 DIAGNOSIS — Z23 Encounter for immunization: Secondary | ICD-10-CM | POA: Diagnosis not present

## 2019-01-16 MED ORDER — HYDROCHLOROTHIAZIDE 25 MG PO TABS: 25.0000 mg | ORAL_TABLET | Freq: Every day | ORAL | 3 refills | Status: DC

## 2019-01-16 MED ORDER — OMEPRAZOLE 20 MG PO CPDR: DELAYED_RELEASE_CAPSULE | ORAL | 3 refills | Status: DC

## 2019-01-16 MED ORDER — BENAZEPRIL HCL 10 MG PO TABS: 10.0000 mg | ORAL_TABLET | Freq: Every day | ORAL | 3 refills | Status: DC

## 2019-01-16 NOTE — Progress Notes (Signed)
HPI: Monica Mcdaniel is a 48 y.o. female who  has no past medical history on file.  she presents to [facility] today, 01/16/19,  for chief complaint of:  BP follow-up  BP check - a bit overdue d/t pandemic. BP borderline here and she hasn't been checking often at home. No CP/SOB, no HA/VC,Dizziness.   BP Readings from Last 3 Encounters:  01/16/19 138/86  05/01/18 122/76  03/12/18 122/80   Mild dry cough bothersome intermittently more so at night, she has noted improvement with the antacids. No significant GERD/heartburn symptoms otherwise, no SOB.     At today's visit 01/16/19 ... PMH, PSH, FH reviewed and updated as needed.  Current medication list and allergy/intolerance hx reviewed and updated as needed. (See remainder of HPI, ROS, Phys Exam below)         ASSESSMENT/PLAN: The primary encounter diagnosis was Essential hypertension. A diagnosis of Need for influenza vaccination was also pertinent to this visit.   Cough seems likely GERD, possible postnasal drip or laryngospasm, pt not overly bothered lately, no additional w/u needed at this time  Borderline B, would like to get consistnetly <130/80, t to monitor at home   Orders Placed This Encounter  Procedures  . Flu Vaccine QUAD 6+ mos PF IM (Fluarix Quad PF)     Meds ordered this encounter  Medications  . omeprazole (PRILOSEC) 20 MG capsule    Sig: TAKE 1 CAPSULE BY MOUTH EVERY DAY    Dispense:  90 capsule    Refill:  3  . hydrochlorothiazide (HYDRODIURIL) 25 MG tablet    Sig: Take 1 tablet (25 mg total) by mouth daily.    Dispense:  90 tablet    Refill:  3  . benazepril (LOTENSIN) 10 MG tablet    Sig: Take 1 tablet (10 mg total) by mouth daily. Must schedule appt for refills    Dispense:  90 tablet    Refill:  3       Follow-up plan: Return in about 6 months (around 07/17/2019) for ANNUAL (call week prior to visit for lab  orders).                                                 ################################################# ################################################# ################################################# #################################################    Current Meds  Medication Sig  . benazepril (LOTENSIN) 10 MG tablet TAKE 1 TABLET (10 MG TOTAL) BY MOUTH DAILY. MUST SCHEDULE APPT FOR REFILLS  . famotidine (PEPCID) 20 MG tablet Take 20 mg by mouth 2 (two) times daily.  . hydrochlorothiazide (HYDRODIURIL) 25 MG tablet TAKE 1 TABLET BY MOUTH EVERY DAY  . omeprazole (PRILOSEC) 20 MG capsule TAKE 1 CAPSULE BY MOUTH EVERY DAY    No Known Allergies     Review of Systems:  Constitutional: No recent illness  HEENT: No  headache, no vision change  Cardiac: No  chest pain, No  pressure, No palpitations  Respiratory:  No  shortness of breath. +mild Cough  Gastrointestinal: No  abdominal pain, no change on bowel habits  Neurologic: No  weakness, No  Dizziness  Psychiatric: No  concerns with depression, No  concerns with anxiety  Exam:  BP 138/86 (BP Location: Left Arm, Patient Position: Sitting, Cuff Size: Large)   Pulse (!) 102   Temp 98.2 F (36.8 C) (Oral)   Wt 196 lb  0.6 oz (88.9 kg)   BMI 37.04 kg/m   Constitutional: VS see above. General Appearance: alert, well-developed, well-nourished, NAD  Eyes: Normal lids and conjunctive, non-icteric sclera  Neck: No masses, trachea midline.   Respiratory: Normal respiratory effort. no wheeze, no rhonchi, no rales  Cardiovascular: S1/S2 normal, no murmur, no rub/gallop auscultated. RRR.   Musculoskeletal: Gait normal  Neurological: Normal balance/coordination. No tremor.  Skin: warm, dry, intact.   Psychiatric: Normal judgment/insight. Normal mood and affect. Oriented x3.       Visit summary with medication list and pertinent instructions was printed for patient to  review, patient was advised to alert [place] if any updates are needed. All questions at time of visit were answered - patient instructed to contact office with any additional concerns. ER/RTC precautions were reviewed with the patient and understanding verbalized.   Note: Total time spent 15 minutes, greater than 50% of the visit was spent face-to-face counseling and coordinating care for the following: The primary encounter diagnosis was Essential hypertension. A diagnosis of Need for influenza vaccination was also pertinent to this visit.Marland Kitchen  Please note: voice recognition software was used to produce this document, and typos may escape review. Please contact Dr. Sheppard Coil for any needed clarifications.    Follow up plan: Return in about 6 months (around 07/17/2019) for [place] (call week prior to visit for lab orders).

## 2019-01-18 ENCOUNTER — Encounter: Payer: Self-pay | Admitting: Osteopathic Medicine

## 2019-04-14 DIAGNOSIS — Z20828 Contact with and (suspected) exposure to other viral communicable diseases: Secondary | ICD-10-CM | POA: Diagnosis not present

## 2019-04-14 DIAGNOSIS — Z03818 Encounter for observation for suspected exposure to other biological agents ruled out: Secondary | ICD-10-CM | POA: Diagnosis not present

## 2019-04-16 DIAGNOSIS — Z20828 Contact with and (suspected) exposure to other viral communicable diseases: Secondary | ICD-10-CM | POA: Diagnosis not present

## 2019-04-16 DIAGNOSIS — Z03818 Encounter for observation for suspected exposure to other biological agents ruled out: Secondary | ICD-10-CM | POA: Diagnosis not present

## 2019-05-06 ENCOUNTER — Encounter: Payer: Self-pay | Admitting: Medical-Surgical

## 2019-06-21 ENCOUNTER — Ambulatory Visit (INDEPENDENT_AMBULATORY_CARE_PROVIDER_SITE_OTHER)
Admission: RE | Admit: 2019-06-21 | Discharge: 2019-06-21 | Disposition: A | Discharge status: Home / Self Care | Payer: BC Managed Care – PPO | Source: Ambulatory Visit

## 2019-06-21 DIAGNOSIS — N3001 Acute cystitis with hematuria: Secondary | ICD-10-CM

## 2019-06-21 MED ORDER — CEPHALEXIN 500 MG PO CAPS: 500.0000 mg | ORAL_CAPSULE | Freq: Two times a day (BID) | ORAL | 0 refills | Status: AC

## 2019-06-21 NOTE — ED Provider Notes (Signed)
Virtual Visit via Video Note:  Monica Mcdaniel  initiated request for Telemedicine visit with [facility] team. I connected with Monica Mcdaniel  on 06/21/2019 at 11:02 AM  for a synchronized telemedicine visit using a video enabled HIPPA compliant telemedicine application. I verified that I am speaking with Monica Mcdaniel  using two identifiers. Mickie Bail, NP  was physically located in a [facility] Urgent care site and Monica Mcdaniel was located at a different location.   The limitations of evaluation and management by telemedicine as well as the availability of in-person appointments were discussed. Patient was informed that she  may incur a bill ( including co-pay) for this virtual visit encounter. Monica Mcdaniel  expressed understanding and gave verbal consent to proceed with virtual visit.     History of Present Illness:Monica Mcdaniel  is a 49 y.o. female presents for evaluation of dysuria, frequency, pressure sensation after urination x 6 days.  This morning she noted blood in her urine.  She denies fever, chills, abdominal pain, back pain, vaginal discharge, pelvic pain, or other symptoms.  No treatment attempted at home.  Not currently pregnant or breastfeeding.     No Known Allergies   History reviewed. No pertinent past medical history.   Social History   Tobacco Use  . Smoking status: Former Smoker    Packs/day: 1.00    Years: 7.00    Pack years: 7.00    Quit date: 1997    Years since quitting: 24.3  . Smokeless tobacco: Former Engineer, water Use Topics  . Alcohol use: Yes    Comment: 3-4/wk  . Drug use: Never   ROS: as stated in HPI.  All other systems reviewed and negative.      Observations/Objective: Physical Exam  VITALS: Patient denies fever. GENERAL: Alert, appears well and in no acute distress. HEENT: Atraumatic. NECK: Normal movements of the head and neck. CARDIOPULMONARY: No increased WOB. Speaking in clear sentences. I:E ratio WNL.  MS: Moves all visible extremities  without noticeable abnormality. PSYCH: Pleasant and cooperative, well-groomed. Speech normal rate and rhythm. Affect is appropriate. Insight and judgement are appropriate. Attention is focused, linear, and appropriate.  NEURO: CN grossly intact. Oriented as arrived to appointment on time with no prompting. Moves both UE equally.  SKIN: No obvious lesions, wounds, erythema, or cyanosis noted on face or hands.   Assessment and Plan:    ICD-10-CM   1. Acute cystitis with hematuria  N30.01        Follow Up Instructions: Treating with Keflex.  Instructed patient to increase her water intake and take OTC Azo as needed for discomfort.  Instructed her to follow-up with her PCP or come here to be seen in person if her symptoms or not improving.  Patient agrees with plan of care.      I discussed the assessment and treatment plan with the patient. The patient was provided an opportunity to ask questions and all were answered. The patient agreed with the plan and demonstrated an understanding of the instructions.   The patient was advised to call back or seek an in-person evaluation if the symptoms worsen or if the condition fails to improve as anticipated.      Mickie Bail, NP  06/21/2019 11:02 AM         Mickie Bail, NP 06/21/19 1102

## 2019-06-21 NOTE — Discharge Instructions (Signed)
Take the antibiotic as directed.  Increase your water intake.  Consider over-the-counter Azo as needed for discomfort; follow the directions on the package.    Follow up with your primary care provider or come here to be seen in person if your symptoms are not improving.

## 2019-07-17 ENCOUNTER — Other Ambulatory Visit: Payer: Self-pay

## 2019-07-17 ENCOUNTER — Ambulatory Visit (INDEPENDENT_AMBULATORY_CARE_PROVIDER_SITE_OTHER): Payer: BC Managed Care – PPO

## 2019-07-17 ENCOUNTER — Ambulatory Visit (INDEPENDENT_AMBULATORY_CARE_PROVIDER_SITE_OTHER): Payer: BC Managed Care – PPO | Admitting: Osteopathic Medicine

## 2019-07-17 VITALS — BP 122/82 | HR 85 | Temp 98.0°F | Ht 61.0 in | Wt 196.0 lb

## 2019-07-17 DIAGNOSIS — R739 Hyperglycemia, unspecified: Secondary | ICD-10-CM | POA: Diagnosis not present

## 2019-07-17 DIAGNOSIS — I10 Essential (primary) hypertension: Secondary | ICD-10-CM | POA: Diagnosis not present

## 2019-07-17 DIAGNOSIS — Z0000 Encounter for general adult medical examination without abnormal findings: Secondary | ICD-10-CM | POA: Diagnosis not present

## 2019-07-17 DIAGNOSIS — Z1231 Encounter for screening mammogram for malignant neoplasm of breast: Secondary | ICD-10-CM | POA: Diagnosis not present

## 2019-07-17 DIAGNOSIS — D509 Iron deficiency anemia, unspecified: Secondary | ICD-10-CM

## 2019-07-17 DIAGNOSIS — Z1211 Encounter for screening for malignant neoplasm of colon: Secondary | ICD-10-CM

## 2019-07-17 MED ORDER — OMEPRAZOLE 20 MG PO CPDR: DELAYED_RELEASE_CAPSULE | ORAL | 3 refills | Status: DC

## 2019-07-17 MED ORDER — FAMOTIDINE 20 MG PO TABS: ORAL_TABLET | ORAL | 3 refills | Status: DC

## 2019-07-17 MED ORDER — HYDROCHLOROTHIAZIDE 25 MG PO TABS: 25.0000 mg | ORAL_TABLET | Freq: Every day | ORAL | 3 refills | Status: DC

## 2019-07-17 MED ORDER — BENAZEPRIL HCL 10 MG PO TABS: 10.0000 mg | ORAL_TABLET | Freq: Every day | ORAL | 3 refills | Status: DC

## 2019-07-17 NOTE — Progress Notes (Signed)
Monica Mcdaniel is a 49 y.o. female who presents to  [facility] at [facility]  today, 07/17/19, seeking care for the following: . Annual physical  o Doing well o Stressed d/t work, could be exercising more  o Does not feel need for Rx/counseling re: mental health, advised to let m eknow if this changes      ASSESSMENT & PLAN with other pertinent history/findings:  The primary encounter diagnosis was Annual physical exam. Diagnoses of Essential hypertension, Hyperglycemia, Breast cancer screening by mammogram, and Colon cancer screening were also pertinent to this visit.   Patient Instructions  General Preventive Care  Most recent routine screening lipids/other labs: ordered   Tobacco: don't!   Alcohol: responsible moderation is ok for most adults - if you have concerns about your alcohol intake, please talk to me!   Exercise: as tolerated to reduce risk of cardiovascular disease and diabetes. Strength training will also prevent osteoporosis.   Mental health: if need for mental health care (medicines, counseling, other), or concerns about moods, please let me know!   Sexual health: if ever need for STD testing, or if concerns with libido/pain problems, please let me know!   Advanced Directive: Living Will and/or Healthcare Power of Attorney recommended for all adults, regardless of age or health.  Vaccines  Flu vaccine: recommended for almost everyone, late fall/early winter.   Shingles vaccine: at age 64.  Pneumonia vaccines: at age 66  Tetanus booster: Tdap recommended every 10 years. Due 09/2024  COVID vaccine: thanks for getting your shot! :) Cancer screenings   Colon cancer screening: recommended for everyone age 81-75  Breast cancer screening: mammogram recommended at age 45 every other year at least, and annually after age 75.  Cervical cancer screening: Pap due 03/2018 per our records Please see OBGYN   Lung cancer  screening: not needed w/ remote history of smoking  Infection screenings  HIV, Gonorrhea/Chlamydia: screening as needed  Hepatitis C: recommended for anyone born 53-1965  TB: certain at-risk populations, or depending on work requirements and/or travel history Other  Bone Density Test: recommended for women at age 61    Orders Placed This Encounter  Procedures  . MM 3D SCREEN BREAST BILATERAL  . CBC  . COMPLETE METABOLIC PANEL WITH GFR  . Lipid panel  . Hemoglobin A1c  . Ambulatory referral to Gastroenterology    Meds ordered this encounter  Medications  . benazepril (LOTENSIN) 10 MG tablet    Sig: Take 1 tablet (10 mg total) by mouth daily.    Dispense:  90 tablet    Refill:  3  . hydrochlorothiazide (HYDRODIURIL) 25 MG tablet    Sig: Take 1 tablet (25 mg total) by mouth daily.    Dispense:  90 tablet    Refill:  3  . famotidine (PEPCID) 20 MG tablet    Sig: 20-40 mg daily for acid reflux    Dispense:  180 tablet    Refill:  3  . omeprazole (PRILOSEC) 20 MG capsule    Sig: TAKE 1 CAPSULE BY MOUTH EVERY DAY    Dispense:  90 capsule    Refill:  3       Follow-up instructions: Return in about 1 year (around 07/16/2020) for ANNUAL (call week prior to visit for lab orders).  BP 122/82 (BP Location: Right Arm, Patient Position: Sitting)   Pulse 85   Temp 98 F (36.7 C)   Ht 5\' 1"  (1.549 m)   Wt 196 lb (88.9 kg)   SpO2 98%   BMI 37.03 kg/m   No outpatient medications have been marked as taking for the 07/17/19 encounter (Office Visit) with Emeterio Reeve, DO.    No results found for this or any previous visit (from the past 72 hour(s)).  No results found.  Depression screen [facility] 2/9 07/17/2019 05/01/2018 03/12/2018  Decreased Interest 1 0 0  Down, Depressed, Hopeless 1 0 0  PHQ - 2 Score 2 0 0  Altered sleeping 3 3 3   Tired, decreased energy 3 3 2   Change in appetite 3 0 0   Feeling bad or failure about yourself  0 0 0  Trouble concentrating 0 0 0  Moving slowly or fidgety/restless 0 0 0  Suicidal thoughts 0 0 0  PHQ-9 Score 11 6 5   Difficult doing work/chores Somewhat difficult Somewhat difficult Somewhat difficult    GAD 7 : Generalized Anxiety Score 07/17/2019 05/01/2018 03/12/2018 10/31/2017  Nervous, Anxious, on Edge 3 3 1  0  Control/stop worrying 3 2 0 0  Worry too much - different things 3 1 0 0  Trouble relaxing 3 1 0 0  Restless 3 1 0 0  Easily annoyed or irritable 3 2 1  0  Afraid - awful might happen 0 0 0 0  Total GAD 7 Score 18 10 2  0  Anxiety Difficulty Somewhat difficult Somewhat difficult Not difficult at all -    Constitutional:  . VSS, see nurse notes . General Appearance: alert, well-developed, well-nourished, NAD Eyes: Marland Kitchen Normal lids and conjunctive, non-icteric sclera . PERRLA Ears, Nose, Mouth, Throat: . Normal appearance Neck: . No masses, trachea midline . No thyroid enlargement/tenderness/mass appreciated Respiratory: . Normal respiratory effort . Breath sounds normal, no wheeze/rhonchi/rales Cardiovascular: . S1/S2 normal, no murmur/rub/gallop auscultated . No lower extremity edema Gastrointestinal: . Nontender, no masses . No hepatomegaly, no splenomegaly . No hernia appreciated Musculoskeletal:  . Gait normal . No clubbing/cyanosis of digits Neurological: . No cranial nerve deficit on limited exam . Motor and sensation intact and symmetric Psychiatric: . Normal judgment/insight . Normal mood and affect   All questions at time of visit were answered - patient instructed to contact office with any additional concerns or updates.  ER/RTC precautions were reviewed with the patient.  Please note: voice recognition software was used to produce this document, and typos may escape review. Please contact Dr. Sheppard Coil for any needed clarifications.

## 2019-07-17 NOTE — Patient Instructions (Signed)
General Preventive Care  Most recent routine screening lipids/other labs: ordered   Tobacco: don't!   Alcohol: responsible moderation is ok for most adults - if you have concerns about your alcohol intake, please talk to me!   Exercise: as tolerated to reduce risk of cardiovascular disease and diabetes. Strength training will also prevent osteoporosis.   Mental health: if need for mental health care (medicines, counseling, other), or concerns about moods, please let me know!   Sexual health: if ever need for STD testing, or if concerns with libido/pain problems, please let me know!   Advanced Directive: Living Will and/or Healthcare Power of Attorney recommended for all adults, regardless of age or health.  Vaccines  Flu vaccine: recommended for almost everyone, late fall/early winter.   Shingles vaccine: at age 49.  Pneumonia vaccines: at age 37  Tetanus booster: Tdap recommended every 10 years. Due 09/2024  COVID vaccine: thanks for getting your shot! :) Cancer screenings   Colon cancer screening: recommended for everyone age 76-75  Breast cancer screening: mammogram recommended at age 44 every other year at least, and annually after age 47.  Cervical cancer screening: Pap due 03/2018 per our records Please see OBGYN   Lung cancer screening: not needed w/ remote history of smoking  Infection screenings  HIV, Gonorrhea/Chlamydia: screening as needed  Hepatitis C: recommended for anyone born 27-1965  TB: certain at-risk populations, or depending on work requirements and/or travel history Other  Bone Density Test: recommended for women at age 56

## 2019-07-18 DIAGNOSIS — D509 Iron deficiency anemia, unspecified: Secondary | ICD-10-CM | POA: Insufficient documentation

## 2019-07-18 NOTE — Addendum Note (Signed)
Addended by: Deirdre Pippins on: 07/18/2019 09:00 AM   Modules accepted: Orders

## 2019-07-19 LAB — HEMOGLOBIN A1C
Hgb A1c MFr Bld: 5.6 % of total Hgb (ref <5.7)
Mean Plasma Glucose: 114 (calc)
eAG (mmol/L): 6.3 (calc)

## 2019-07-19 LAB — CBC
HCT: 34.7 % — ABNORMAL LOW (ref 35.0–45.0)
Hemoglobin: 10.7 g/dL — ABNORMAL LOW (ref 11.7–15.5)
MCH: 24 pg — ABNORMAL LOW (ref 27.0–33.0)
MCHC: 30.8 g/dL — ABNORMAL LOW (ref 32.0–36.0)
MCV: 78 fL — ABNORMAL LOW (ref 80.0–100.0)
MPV: 10 fL (ref 7.5–12.5)
Platelets: 329 10*3/uL (ref 140–400)
RBC: 4.45 10*6/uL (ref 3.80–5.10)
RDW: 14.4 % (ref 11.0–15.0)
WBC: 5.4 10*3/uL (ref 3.8–10.8)

## 2019-07-19 LAB — TEST AUTHORIZATION

## 2019-07-19 LAB — COMPLETE METABOLIC PANEL WITH GFR
AG Ratio: 1.7 (calc) (ref 1.0–2.5)
ALT: 28 U/L (ref 6–29)
AST: 18 U/L (ref 10–35)
Albumin: 4.3 g/dL (ref 3.6–5.1)
Alkaline phosphatase (APISO): 76 U/L (ref 31–125)
BUN: 13 mg/dL (ref 7–25)
CO2: 28 mmol/L (ref 20–32)
Calcium: 9.4 mg/dL (ref 8.6–10.2)
Chloride: 102 mmol/L (ref 98–110)
Creat: 0.82 mg/dL (ref 0.50–1.10)
GFR, Est African American: 97 mL/min/{1.73_m2} (ref >=60)
GFR, Est Non African American: 84 mL/min/{1.73_m2} (ref >=60)
Globulin: 2.6 g/dL (calc) (ref 1.9–3.7)
Glucose, Bld: 108 mg/dL — ABNORMAL HIGH (ref 65–99)
Potassium: 3.9 mmol/L (ref 3.5–5.3)
Sodium: 139 mmol/L (ref 135–146)
Total Bilirubin: 0.5 mg/dL (ref 0.2–1.2)
Total Protein: 6.9 g/dL (ref 6.1–8.1)

## 2019-07-19 LAB — IRON,TIBC AND FERRITIN PANEL
%SAT: 5 % (calc) — ABNORMAL LOW (ref 16–45)
Ferritin: 6 ng/mL — ABNORMAL LOW (ref 16–232)
Iron: 20 ug/dL — ABNORMAL LOW (ref 40–190)
TIBC: 414 mcg/dL (calc) (ref 250–450)

## 2019-07-19 LAB — LIPID PANEL
Cholesterol: 164 mg/dL (ref <200)
HDL: 51 mg/dL (ref >=50)
LDL Cholesterol (Calc): 98 mg/dL (calc)
Non-HDL Cholesterol (Calc): 113 mg/dL (calc) (ref <130)
Total CHOL/HDL Ratio: 3.2 (calc) (ref <5.0)
Triglycerides: 66 mg/dL (ref <150)

## 2019-07-22 ENCOUNTER — Encounter: Payer: Self-pay | Admitting: Osteopathic Medicine

## 2019-07-22 DIAGNOSIS — D509 Iron deficiency anemia, unspecified: Secondary | ICD-10-CM | POA: Insufficient documentation

## 2019-07-22 HISTORY — DX: Iron deficiency anemia, unspecified: D50.9

## 2019-08-22 ENCOUNTER — Emergency Department
Admission: RE | Admit: 2019-08-22 | Discharge: 2019-08-22 | Disposition: A | Discharge status: Home / Self Care | Payer: BC Managed Care – PPO | Source: Ambulatory Visit

## 2019-08-22 ENCOUNTER — Other Ambulatory Visit: Payer: Self-pay

## 2019-08-22 VITALS — BP 123/74 | HR 94 | Temp 97.6°F | Resp 15

## 2019-08-22 DIAGNOSIS — L255 Unspecified contact dermatitis due to plants, except food: Secondary | ICD-10-CM | POA: Diagnosis not present

## 2019-08-22 HISTORY — DX: Gastro-esophageal reflux disease without esophagitis: K21.9

## 2019-08-22 MED ORDER — DEXAMETHASONE SODIUM PHOSPHATE 10 MG/ML IJ SOLN
10.0000 mg | Freq: Once | INTRAMUSCULAR | Status: AC
  Administered 2019-08-22: 10 mg via INTRAMUSCULAR

## 2019-08-22 MED ORDER — PREDNISONE 50 MG PO TABS: 50.0000 mg | ORAL_TABLET | Freq: Every day | ORAL | 0 refills | Status: AC

## 2019-08-22 MED ORDER — CETIRIZINE HCL 10 MG PO TABS: 10.0000 mg | ORAL_TABLET | Freq: Every day | ORAL | 0 refills | Status: DC

## 2019-08-22 NOTE — Discharge Instructions (Signed)
  You were given a shot of decadron (a steroid) today to help with itching and swelling from a likely allergic reaction.  You have been prescribed 5 days of prednisone, an oral steroid.  You may start this medication tomorrow with breakfast.    Follow up with family medicine in 1 week if needed.

## 2019-08-22 NOTE — ED Provider Notes (Signed)
[facility]    CSN: [id number] Arrival date & time: 08/22/19  0857      History   Chief Complaint Chief Complaint  Patient presents with  . Appointment  . Poison Ivy  . Rash    HPI Monica Mcdaniel is a 49 y.o. female.   HPI Monica Mcdaniel is a 49 y.o. female presenting to UC with c/o red itchy rash with mild swelling under her left eye that started 2 days ago, c/w other rashes from poison ivy she has had in the past. [place] and has dogs she believes transfer the oils from the plants to her.  She also has a small rash on her chin and left hand.  She has not taken anything for her symptoms yet. The rash typically resolves on its own, however, she is concerned with the rash being under her eye. Denies change in vision or discharge from eye.    Past Medical History:  Diagnosis Date  . GERD (gastroesophageal reflux disease)   . Iron deficiency anemia 07/22/2019    Patient Active Problem List   Diagnosis Date Noted  . Iron deficiency anemia 07/22/2019  . Gastroesophageal reflux disease 05/01/2018  . Persistent cough 05/01/2018  . Hyperglycemia 11/08/2017  . Hypertension 10/10/2017    Past Surgical History:  Procedure Laterality Date  . CHOLECYSTECTOMY    . OOPHORECTOMY    . REFRACTIVE SURGERY    . TUBAL LIGATION      OB History   No obstetric history on file.      Home Medications    Prior to Admission medications   Medication Sig Start Date End Date Taking? Authorizing Provider  Cholecalciferol 50 MCG (2000 UT) CAPS Take by mouth.   Yes [provider]  hydrochlorothiazide (HYDRODIURIL) 25 MG tablet Take 1 tablet (25 mg total) by mouth daily. 07/17/19  Yes Sunnie Nielsen, DO  omeprazole (PRILOSEC) 20 MG capsule TAKE 1 CAPSULE BY MOUTH EVERY DAY 07/17/19  Yes Sunnie Nielsen, DO  benazepril (LOTENSIN) 10 MG tablet Take 1 tablet (10 mg total) by mouth daily. 07/17/19   Sunnie Nielsen, DO  cetirizine (ZYRTEC) 10 MG tablet Take 1  tablet (10 mg total) by mouth daily. 08/22/19   Lurene Shadow, PA-C  famotidine (PEPCID) 20 MG tablet 20-40 mg daily for acid reflux 07/17/19   Sunnie Nielsen, DO  naproxen sodium (ALEVE) 220 MG tablet Take 220 mg by mouth.    [provider]  predniSONE (DELTASONE) 50 MG tablet Take 1 tablet (50 mg total) by mouth daily with breakfast for 5 days. 08/22/19 08/27/19  Lurene Shadow, PA-C  Vitamin D, Ergocalciferol, (DRISDOL) 1.25 MG (50000 UT) CAPS capsule Take 1 capsule (50,000 Units total) by mouth every 7 (seven) days. Take for 12 total doses(weeks) at which time can transition to 1000 to 2000 units OTC daily 05/03/18   Sunnie Nielsen, DO    Family History Family History  Problem Relation Age of Onset  . High blood pressure Mother   . Diabetes Mother   . Lung cancer Mother   . Diabetes Maternal Grandmother   . Stroke Maternal Grandmother   . Healthy Brother   . Healthy Brother     Social History Social History   Tobacco Use  . Smoking status: Former Smoker    Packs/day: 1.00    Years: 7.00    Pack years: 7.00    Quit date: 1997    Years since quitting: 24.5  . Smokeless  tobacco: Former Clinical biochemist  . Vaping Use: Never used  Substance Use Topics  . Alcohol use: Yes    Comment: 3-4/wk  . Drug use: Never     Allergies   Patient has no known allergies.   Review of Systems Review of Systems  HENT: Positive for facial swelling. Negative for trouble swallowing.   Respiratory: Negative for shortness of breath.   Skin: Positive for rash.     Physical Exam Triage Vital Signs ED Triage Vitals  Enc Vitals Group     BP 08/22/19 0910 123/74     Pulse Rate 08/22/19 0910 94     Resp 08/22/19 0910 15     Temp 08/22/19 0910 97.6 F (36.4 C)     Temp Source 08/22/19 0910 Oral     SpO2 08/22/19 0910 98 %     Weight --      Height --      Head Circumference --      Peak Flow --      Pain Score 08/22/19 0913 0     Pain Loc --      Pain Edu? --       Excl. in GC? --    No data found.  Updated Vital Signs BP 123/74 (BP Location: Left Arm)   Pulse 94   Temp 97.6 F (36.4 C) (Oral)   Resp 15   LMP 08/14/2019 (Exact Date)   SpO2 98%   Visual Acuity Right Eye Distance:   Left Eye Distance:   Bilateral Distance:    Right Eye Near:   Left Eye Near:    Bilateral Near:     Physical Exam Vitals and nursing note reviewed.  Constitutional:      Appearance: Normal appearance. She is well-developed.  HENT:     Head: Normocephalic and atraumatic.      Right Ear: External ear normal.     Left Ear: External ear normal.  Cardiovascular:     Rate and Rhythm: Normal rate.  Pulmonary:     Effort: Pulmonary effort is normal.  Musculoskeletal:        General: Normal range of motion.     Cervical back: Normal range of motion.  Skin:    General: Skin is warm and dry.     Findings: Erythema and rash present.          Comments: 2-5cm erythematous maculopapular lesions on Left cheek, under chin and on Left hand.  Neurological:     Mental Status: She is alert and oriented to person, place, and time.  Psychiatric:        Behavior: Behavior normal.      UC Treatments / Results  Labs (all labs ordered are listed, but only abnormal results are displayed) Labs Reviewed - No data to display  EKG   Radiology No results found.  Procedures Procedures (including critical care time)  Medications Ordered in UC Medications  dexamethasone (DECADRON) injection 10 mg (10 mg Intramuscular Given 08/22/19 0942)    Initial Impression / Assessment and Plan / UC Course  I have reviewed the triage vital signs and the nursing notes.  Pertinent labs & imaging results that were available during my care of the patient were reviewed by me and considered in my medical decision making (see chart for details).     Hx and exam c/w contact dermatitis w/o evidence of anaphylaxis or bacterial infection Will tx with steroids and antihistamines F/u  with PCP as needed  Final Clinical Impressions(s) / UC Diagnoses   Final diagnoses:  Contact dermatitis due to plant     Discharge Instructions      You were given a shot of decadron (a steroid) today to help with itching and swelling from a likely allergic reaction.  You have been prescribed 5 days of prednisone, an oral steroid.  You may start this medication tomorrow with breakfast.    Follow up with family medicine in 1 week if needed.      ED Prescriptions    Medication Sig Dispense Auth. Provider   predniSONE (DELTASONE) 50 MG tablet Take 1 tablet (50 mg total) by mouth daily with breakfast for 5 days. 5 tablet Waylan Rocher O, PA-C   cetirizine (ZYRTEC) 10 MG tablet Take 1 tablet (10 mg total) by mouth daily. 30 tablet Lurene Shadow, [place]     PDMP not reviewed this encounter.   Lurene Shadow, [place] 08/22/19 1708

## 2019-08-22 NOTE — ED Triage Notes (Signed)
Pt noticed rash on Tuesday afternoon under L eye- thinks she may have come in contact w/ poison ivy from her dogs Small area noted to Left arm and under chin Denies vision problems No OTC med oral or topical Pfizer COVID vaccine

## 2019-09-18 ENCOUNTER — Encounter: Payer: BC Managed Care – PPO | Admitting: Obstetrics and Gynecology

## 2019-09-30 ENCOUNTER — Other Ambulatory Visit: Payer: Self-pay

## 2019-09-30 ENCOUNTER — Other Ambulatory Visit (HOSPITAL_COMMUNITY)
Admission: RE | Admit: 2019-09-30 | Discharge: 2019-09-30 | Disposition: A | Discharge status: Home / Self Care | Payer: BC Managed Care – PPO | Source: Ambulatory Visit | Attending: Obstetrics and Gynecology | Admitting: Obstetrics and Gynecology

## 2019-09-30 ENCOUNTER — Encounter: Payer: Self-pay | Admitting: Obstetrics and Gynecology

## 2019-09-30 ENCOUNTER — Ambulatory Visit: Payer: BC Managed Care – PPO | Admitting: Obstetrics and Gynecology

## 2019-09-30 VITALS — BP 140/82 | HR 90 | Resp 18 | Ht 61.5 in | Wt 198.6 lb

## 2019-09-30 DIAGNOSIS — Z01419 Encounter for gynecological examination (general) (routine) without abnormal findings: Secondary | ICD-10-CM | POA: Insufficient documentation

## 2019-09-30 NOTE — Patient Instructions (Signed)

## 2019-09-30 NOTE — Progress Notes (Addendum)
49 y.o. G26P0013 Married Caucasian female here for annual exam.    No problems with her menses.   Having urinary incontinence.  Wearing a pad daily due leakage of urine.  Has a cough for a while, attributed to reflux, and this makes her leak.  No urgency or leakage related to urgency.  No leakage at night.   Struggling with chronic cough.  Symptoms started with antiHTN. Recently, this has been attributed to reflux. She wonders if she has allergies or something else occurring.   Completed for Covid vaccine.   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   PCP: Sunnie Nielsen, DO  Patient's last menstrual period was 09/10/2019 (exact date).     Period Cycle (Days): 30 Period Duration (Days): 5-6 Period Pattern: Regular Menstrual Flow:  (light first day, heavy and tapers off) Menstrual Control: Maxi pad Menstrual Control Change Freq (Hours): every 4-5 hours Dysmenorrhea: None (lower back pain)     Sexually active: Yes.    The current method of family planning is tubal ligation.    Exercising: No.  The patient does not participate in regular exercise at present. Smoker:  Former  Health Maintenance: Pap:  04-01-15 Neg.  Thinks she did not have HPV screening.  History of abnormal Pap:  no MMG: 07-17-19 3D/Neg/Density B/BiRads1 Colonoscopy:  n/a BMD:   n/a  Result  n/a TDaP: 09-18-14 Gardasil:   no HIV:Neg in preg Hep C: Unsure Screening Labs:  PCP   reports that she quit smoking about 24 years ago. She has a 7.00 pack-year smoking history. She has quit using smokeless tobacco. She reports current alcohol use of about 3.0 - 4.0 standard drinks of alcohol per week. She reports that she does not use drugs.  Past Medical History:  Diagnosis Date   Anxiety    GERD (gastroesophageal reflux disease)    Heart murmur    functional heart murmur--as a child   Hypertension    Iron deficiency anemia 07/22/2019    Past Surgical History:  Procedure Laterality Date    CHOLECYSTECTOMY     KNEE SURGERY Left    removal of benign tumor   REFRACTIVE SURGERY     TUBAL LIGATION      Current Outpatient Medications  Medication Sig Dispense Refill   benazepril (LOTENSIN) 10 MG tablet Take 1 tablet (10 mg total) by mouth daily. 90 tablet 3   Ferrous Sulfate (IRON PO) Take 1 tablet by mouth daily.     hydrochlorothiazide (HYDRODIURIL) 25 MG tablet Take 1 tablet (25 mg total) by mouth daily. 90 tablet 3   naproxen sodium (ALEVE) 220 MG tablet Take 220 mg by mouth.     omeprazole (PRILOSEC) 20 MG capsule TAKE 1 CAPSULE BY MOUTH EVERY DAY 90 capsule 3   No current facility-administered medications for this visit.    Family History  Problem Relation Age of Onset   High blood pressure Mother    Diabetes Mother    Lung cancer Mother    Hypertension Mother    Diabetes Maternal Grandmother    Stroke Maternal Grandmother    Thyroid disease Maternal Grandmother    Healthy Brother    Healthy Brother     Review of Systems  Genitourinary:       Urinary incontinence  All other systems reviewed and are negative.   Exam:   BP 140/82 (Cuff Size: Large)    Pulse 90    Resp 18    Ht 5' 1.5" (1.562 m)  Wt 198 lb 9.6 oz (90.1 kg)    LMP 09/10/2019 (Exact Date)    BMI 36.92 kg/m     General appearance: alert, cooperative and appears stated age Head: normocephalic, without obvious abnormality, atraumatic Neck: no adenopathy, supple, symmetrical, trachea midline and thyroid normal to inspection and palpation Lungs: rhonchi. Breasts: normal appearance, no masses or tenderness, No nipple retraction or dimpling, No nipple discharge or bleeding, No axillary adenopathy Heart: regular rate and rhythm Abdomen: soft, non-tender; no masses, no organomegaly Extremities: extremities normal, atraumatic, no cyanosis or edema Skin: skin color, texture, turgor normal. No rashes or lesions Lymph nodes: cervical, supraclavicular, and axillary nodes  normal. Neurologic: grossly normal  Pelvic: External genitalia:  no lesions              No abnormal inguinal nodes palpated.              Urethra:  normal appearing urethra with no masses, tenderness or lesions              Bartholins and Skenes: normal                 Vagina: normal appearing vagina with normal color and discharge, no lesions.  Good support.               Cervix: no lesions              Pap taken: Yes.   Bimanual Exam:  Uterus:  normal size, contour, position, consistency, mobility, non-tender              Adnexa: no mass, fullness, tenderness              Rectal exam: Yes.  .  Confirms.              Anus:  normal sphincter tone, no lesions  Chaperone was present for exam.  Assessment:   Well woman visit with normal exam. Status post tubal ligation.  Stress incontinence.  Chronic cough.   Plan: Mammogram screening discussed. Self breast awareness reviewed. Pap and HR HPV as above. Guidelines for Calcium, Vitamin D, regular exercise program including cardiovascular and weight bearing exercise. We discussed stress incontinence and tx options - Impressa, pelvic floor PT, weight loss, midurethral sling.  She will try Impressa first. I did suggest possible consultation with pulmonology.  She may pursue this through her PCP. Follow up annually and prn.   After visit summary provided.

## 2019-10-01 ENCOUNTER — Telehealth: Payer: Self-pay

## 2019-10-01 NOTE — Telephone Encounter (Signed)
Pt called stating that she saw her GYN on 09/30/2019 and that the patient's lungs sounded "squeaky" and suggested that she see a pulmonologist.  Please advise.  Tiajuana Amass, CMA

## 2019-10-02 LAB — CYTOLOGY - PAP
Comment: NEGATIVE
Diagnosis: NEGATIVE
High risk HPV: NEGATIVE

## 2019-10-02 NOTE — Telephone Encounter (Signed)
Can schedule an appointment with me to listen also and discuss

## 2019-10-03 ENCOUNTER — Ambulatory Visit (INDEPENDENT_AMBULATORY_CARE_PROVIDER_SITE_OTHER): Payer: BC Managed Care – PPO | Admitting: Osteopathic Medicine

## 2019-10-03 VITALS — BP 129/85 | HR 94 | Ht 61.5 in | Wt 197.0 lb

## 2019-10-03 DIAGNOSIS — R05 Cough: Secondary | ICD-10-CM

## 2019-10-03 DIAGNOSIS — Z23 Encounter for immunization: Secondary | ICD-10-CM | POA: Diagnosis not present

## 2019-10-03 DIAGNOSIS — R053 Chronic cough: Secondary | ICD-10-CM

## 2019-10-03 NOTE — Progress Notes (Signed)
HPI: Monica Mcdaniel is a 49 y.o. female who  has a past medical history of Anxiety, GERD (gastroesophageal reflux disease), Heart murmur, Hypertension, and Iron deficiency anemia (07/22/2019).  she presents to [facility] today, 10/03/19,  for chief complaint of: Abnormal lung sounds   Patient reports she saw her OB-Gyn a few days ago for her regular well woman exam.OBGYN notes reviewed, mention "rhonchi" on exam.  She mentioned her chronic cough to them, and when they listened to her lungs they stated it sounded "squeaky." She states when they were listening, she felt like she had gas bubbles in her esophagus, but she didn't mention this to them. She states she has had acid reflux for a long time and it was previously well controlled with zantac. However, when this medicine was recalled, she started on omeprazole instead. She states her symptoms have been well controlled on omeprazole and she no longer has a cough when she lies down at night. She sometimes has a cough during the day. Not interested in increase/change Rx at this time.   Past medical, surgical, social and family history reviewed:  Patient Active Problem List   Diagnosis Date Noted  . Iron deficiency anemia 07/22/2019  . Gastroesophageal reflux disease 05/01/2018  . Persistent cough 05/01/2018  . Hyperglycemia 11/08/2017  . Hypertension 10/10/2017    Past Surgical History:  Procedure Laterality Date  . CHOLECYSTECTOMY    . KNEE SURGERY Left    removal of benign tumor  . REFRACTIVE SURGERY    . TUBAL LIGATION      Social History   Tobacco Use  . Smoking status: Former Smoker    Packs/day: 1.00    Years: 7.00    Pack years: 7.00    Quit date: 1997    Years since quitting: 24.6  . Smokeless tobacco: Former Engineer, water Use Topics  . Alcohol use: Yes    Alcohol/week: 3.0 - 4.0 standard drinks    Types: 3 - 4 Glasses of wine per week    Comment: 3-4/wk    Family History  Problem  Relation Age of Onset  . High blood pressure Mother   . Diabetes Mother   . Lung cancer Mother   . Hypertension Mother   . Diabetes Maternal Grandmother   . Stroke Maternal Grandmother   . Thyroid disease Maternal Grandmother   . Healthy Brother   . Healthy Brother      Current medication list and allergy/intolerance information reviewed:    Current Outpatient Medications  Medication Sig Dispense Refill  . benazepril (LOTENSIN) 10 MG tablet Take 1 tablet (10 mg total) by mouth daily. 90 tablet 3  . Ferrous Sulfate (IRON PO) Take 1 tablet by mouth daily.    . hydrochlorothiazide (HYDRODIURIL) 25 MG tablet Take 1 tablet (25 mg total) by mouth daily. 90 tablet 3  . naproxen sodium (ALEVE) 220 MG tablet Take 220 mg by mouth.    Marland Kitchen omeprazole (PRILOSEC) 20 MG capsule TAKE 1 CAPSULE BY MOUTH EVERY DAY 90 capsule 3   No current facility-administered medications for this visit.    No Known Allergies    Review of Systems:  Constitutional:  No  fever, no chills  HEENT: No  headache  Cardiac: No  chest pain  Respiratory:  No  shortness of breath. + chronic dry Cough  Gastrointestinal: No  abdominal pain   Musculoskeletal: No new myalgia/arthralgia  Skin: No  Rash, No other wounds/concerning lesions  Neurologic: No  weakness, No  dizziness  Psychiatric: No  concerns with depression  Exam:  BP 129/85   Pulse 94   Ht 5' 1.5" (1.562 m)   Wt 197 lb (89.4 kg)   LMP 09/10/2019 (Exact Date)   SpO2 98%   BMI 36.62 kg/m   Constitutional: VS see above. General Appearance: alert, well-developed, well-nourished, NAD  Eyes: Normal lids and conjunctive, non-icteric sclera  Neck: No masses, trachea midline.  Respiratory: Normal respiratory effort. no wheeze, no rhonchi, no rales. Some gastric sounds heard at time when patient was having stomach "gurgling." When this stopped, lung sounds were clear.  Cardiovascular: S1/S2 normal, no murmur, no rub/gallop auscultated.  RRR.  Gastrointestinal: Nontender, no masses.   Musculoskeletal: Gait normal. No clubbing/cyanosis of digits.   Neurological: Normal balance/coordination. No tremor. No cranial nerve deficit on limited exam.  Skin: warm, dry, intact. No rash/ulcer. No concerning nevi or subq nodules on limited exam.   Psychiatric: Normal judgment/insight. Normal mood and affect. Oriented x3.    No results found for this or any previous visit (from the past 72 hour(s)).  No results found.   ASSESSMENT/PLAN: The primary encounter diagnosis was Flu vaccine need. A diagnosis of Persistent dry cough was also pertinent to this visit.   Abnormal lung sounds  Lung sounds clear on my exam. Some gastric sounds heard initially, but resolved when patient's symptoms resolved (felt "gurgling" in abdomen at same time the sounds were audible).  Reflux symptoms well controlled on current medication.  Follow up as needed.  Orders Placed This Encounter  Procedures  . Flu Vaccine QUAD 36+ mos IM         Visit summary with medication list and pertinent instructions was printed for patient to review. All questions at time of visit were answered - patient instructed to contact office with any additional concerns or updates. ER/RTC precautions were reviewed with the patient.   Please note: voice recognition software was used to produce this document, and typos may escape review. Please contact Dr. Lyn Hollingshead for any needed clarifications.     Follow-up plan: Return for recheck as needed .  Rutha Bouchard, [facility] MS3

## 2019-10-03 NOTE — Telephone Encounter (Signed)
Appointment has been made. No further questions at this time.  

## 2019-10-09 ENCOUNTER — Encounter: Payer: Self-pay | Admitting: Osteopathic Medicine

## 2020-07-27 ENCOUNTER — Encounter: Payer: Self-pay | Admitting: Emergency Medicine

## 2020-07-27 ENCOUNTER — Emergency Department
Admission: RE | Admit: 2020-07-27 | Discharge: 2020-07-27 | Discharge status: Absent without leave | Payer: BC Managed Care – PPO | Source: Ambulatory Visit

## 2020-07-27 ENCOUNTER — Other Ambulatory Visit: Payer: Self-pay

## 2020-07-27 ENCOUNTER — Emergency Department
Admission: EM | Admit: 2020-07-27 | Discharge: 2020-07-27 | Disposition: A | Discharge status: Home / Self Care | Payer: BC Managed Care – PPO | Source: Home / Self Care

## 2020-07-27 DIAGNOSIS — J40 Bronchitis, not specified as acute or chronic: Secondary | ICD-10-CM

## 2020-07-27 MED ORDER — DEXAMETHASONE SODIUM PHOSPHATE 10 MG/ML IJ SOLN
10.0000 mg | Freq: Once | INTRAMUSCULAR | Status: AC
  Administered 2020-07-27: 10 mg via INTRAMUSCULAR

## 2020-07-27 MED ORDER — BENZONATATE 100 MG PO CAPS
100.0000 mg | ORAL_CAPSULE | Freq: Three times a day (TID) | ORAL | 0 refills | Status: DC
Start: 2020-07-27 — End: 2020-10-08

## 2020-07-27 NOTE — ED Provider Notes (Signed)
[facility]   [id number] 07/27/20 Arrival Time: 1016   SUBJECTIVE:  Monica Mcdaniel is a 50 y.o. female who presents with complaint of persistent dry cough for the last 5 days. Has had 2 negative Covid tests at home. Has taken mucinex with no relief. Overall fatigued. SOB: none. Wheezing: mild. Has negative history of Covid. Has completed Covid vaccines and booster. Has completed flu vaccine in the last year. Denies headache, body aches, chills, abdominal pain, nausea, vomiting, diarrhea, rash, fever, other symptoms.  Social History   Tobacco Use  Smoking Status Former   Packs/day: 1.00   Years: 7.00   Pack years: 7.00   Types: Cigarettes   Quit date: 1997   Years since quitting: 25.4  Smokeless Tobacco Former    ROS: As per HPI.   OBJECTIVE:  Vitals:   07/27/20 1052  BP: 131/88  Pulse: (!) 119  Resp: 17  Temp: 99.1 F (37.3 C)  TempSrc: Oral  SpO2: 98%     General appearance: alert; no distress HEENT: nasal congestion; clear runny nose; throat irritation secondary to post-nasal drainage Neck: supple without LAD Lungs: wheezing to bilateral lower lobes Skin: warm and dry Psychological: alert and cooperative; normal mood and affect  Results for orders placed or performed in visit on 09/30/19  Cytology - PAP( [place])  Result Value Ref Range   High risk HPV Negative    Adequacy      Satisfactory for evaluation; transformation zone component PRESENT.   Diagnosis      - Negative for intraepithelial lesion or malignancy (NILM)   Comment Normal Reference Range HPV - Negative     Labs Reviewed - No data to display  No results found.  No Known Allergies  Past Medical History:  Diagnosis Date   Anxiety    GERD (gastroesophageal reflux disease)    Heart murmur    functional heart murmur--as a child   Hypertension    Iron deficiency anemia 07/22/2019   Social History   Socioeconomic History   Marital status: Married    Spouse name: Not on file    Number of children: 3   Years of education: Not on file   Highest education level: Not on file  Occupational History   Not on file  Tobacco Use   Smoking status: Former    Packs/day: 1.00    Years: 7.00    Pack years: 7.00    Types: Cigarettes    Quit date: 1997    Years since quitting: 25.4   Smokeless tobacco: Former  Building services engineer Use: Never used  Substance and Sexual Activity   Alcohol use: Yes    Alcohol/week: 3.0 - 4.0 standard drinks    Types: 3 - 4 Glasses of wine per week    Comment: 3-4/wk   Drug use: Never   Sexual activity: Yes    Partners: Male    Birth control/protection: None, Surgical    Comment: Tubal  Other Topics Concern   Not on file  Social History Narrative   Not on file   Social Determinants of Health   Financial Resource Strain: Not on file  Food Insecurity: Not on file  Transportation Needs: Not on file  Physical Activity: Not on file  Stress: Not on file  Social Connections: Not on file  Intimate Partner Violence: Not on file   Family History  Problem Relation Age of Onset   High blood pressure Mother    Diabetes Mother  Lung cancer Mother    Hypertension Mother    Diabetes Maternal Grandmother    Stroke Maternal Grandmother    Thyroid disease Maternal Grandmother    Healthy Brother    Healthy Brother      ASSESSMENT & PLAN:  1. Bronchitis     Meds ordered this encounter  Medications   dexamethasone (DECADRON) injection 10 mg   benzonatate (TESSALON) 100 MG capsule    Sig: Take 1 capsule (100 mg total) by mouth every 8 (eight) hours.    Dispense:  21 capsule    Refill:  0    Order Specific Question:   Supervising Provider    Answer:   Merrilee Jansky [id number]   Decadron 10mg  IM in office today Prescribed tessalon perles prn cough Declines Covid testing today Will treat with above therapy given duration of symptoms.  Cough medication sedation precautions.  OTC symptom care as needed. Will plan f/u with  PCP or here as needed.  Reviewed expectations re: course of current medical issues. Questions answered. Outlined signs and symptoms indicating need for more acute intervention. Patient verbalized understanding. After Visit Summary given.                 , NP 07/27/20 1140

## 2020-07-27 NOTE — Discharge Instructions (Signed)
You have received a steroid injection in the office  I have sent in tessalon perles for you to use one capsule every 8 hours as needed for cough.  Follow up with this office or with primary care if symptoms are persisting.  Follow up in the ER for high fever, trouble swallowing, trouble breathing, other concerning symptoms.

## 2020-07-27 NOTE — ED Triage Notes (Signed)
Woke up w/ chest congestion on wed & cough Denies fever  Took Mucinex x 1 yesterday - did not work - pt states mucinex makes her agitated  Wheezing  per pt - no hx of asthma No other OTC meds Denies nasal congestion 2 negative Home Covid tests on thurs & Sun COVID vaccine + booster

## 2020-08-30 ENCOUNTER — Other Ambulatory Visit: Payer: Self-pay

## 2020-08-30 ENCOUNTER — Emergency Department (INDEPENDENT_AMBULATORY_CARE_PROVIDER_SITE_OTHER)
Admission: EM | Admit: 2020-08-30 | Discharge: 2020-08-30 | Disposition: A | Discharge status: Home / Self Care | Payer: BC Managed Care – PPO | Source: Home / Self Care

## 2020-08-30 DIAGNOSIS — R059 Cough, unspecified: Secondary | ICD-10-CM | POA: Diagnosis not present

## 2020-08-30 DIAGNOSIS — J309 Allergic rhinitis, unspecified: Secondary | ICD-10-CM | POA: Diagnosis not present

## 2020-08-30 DIAGNOSIS — R509 Fever, unspecified: Secondary | ICD-10-CM | POA: Diagnosis not present

## 2020-08-30 LAB — POC SARS CORONAVIRUS 2 AG -  ED: SARS Coronavirus 2 Ag: NEGATIVE

## 2020-08-30 NOTE — ED Triage Notes (Signed)
Pt presents to Urgent Care with c/o cough x >1 month. States cough has become productive in the past month. Pt reports being treated for bronchitis approx 1 month ago. Pt reports taking a home COVID test today which was negative.

## 2020-08-30 NOTE — ED Provider Notes (Signed)
[facility]    CSN: [id number] Arrival date & time: 08/30/20  1554      History   Chief Complaint Chief Complaint  Patient presents with   Cough    HPI Monica Mcdaniel is a 50 y.o. female.   HPI patient presents with cough for 4 to 5 weeks.  Patient reports being treated for bronchitis month ago and negative COVID-19 home test this morning.  PMH significant for persistent/chronic cough.  Additionally, patient reports that PCP is treating her for GERD and reports cough can be due to GERD symptoms.  Past Medical History:  Diagnosis Date   Anxiety    GERD (gastroesophageal reflux disease)    Heart murmur    functional heart murmur--as a child   Hypertension    Iron deficiency anemia 07/22/2019    Patient Active Problem List   Diagnosis Date Noted   Iron deficiency anemia 07/22/2019   Gastroesophageal reflux disease 05/01/2018   Persistent cough 05/01/2018   Hyperglycemia 11/08/2017   Hypertension 10/10/2017    Past Surgical History:  Procedure Laterality Date   CHOLECYSTECTOMY     KNEE SURGERY Left    removal of benign tumor   REFRACTIVE SURGERY     TUBAL LIGATION      OB History     Gravida  4   Para  3   Term      Preterm      AB  1   Living  3      SAB      IAB      Ectopic      Multiple      Live Births               Home Medications    Prior to Admission medications   Medication Sig Start Date End Date Taking? Authorizing Provider  benazepril (LOTENSIN) 10 MG tablet Take 1 tablet (10 mg total) by mouth daily. 07/17/19   Sunnie Nielsen, DO  benzonatate (TESSALON) 100 MG capsule Take 1 capsule (100 mg total) by mouth every 8 (eight) hours. 07/27/20   Moshe Cipro, NP  Ferrous Sulfate (IRON PO) Take 1 tablet by mouth daily.    [provider]  hydrochlorothiazide (HYDRODIURIL) 25 MG tablet Take 1 tablet (25 mg total) by mouth daily. 07/17/19   Sunnie Nielsen, DO  naproxen sodium (ALEVE) 220 MG tablet Take  220 mg by mouth. Patient not taking: Reported on 07/27/2020    [provider]  omeprazole (PRILOSEC) 20 MG capsule TAKE 1 CAPSULE BY MOUTH EVERY DAY 07/17/19   Sunnie Nielsen, DO    Family History Family History  Problem Relation Age of Onset   High blood pressure Mother    Diabetes Mother    Lung cancer Mother    Hypertension Mother    Cancer Father    Healthy Brother    Healthy Brother    Diabetes Maternal Grandmother    Stroke Maternal Grandmother    Thyroid disease Maternal Grandmother     Social History Social History   Tobacco Use   Smoking status: Former    Packs/day: 1.00    Years: 7.00    Pack years: 7.00    Types: Cigarettes    Quit date: 1997    Years since quitting: 25.5   Smokeless tobacco: Former  Building services engineer Use: Never used  Substance Use Topics   Alcohol use: Yes    Alcohol/week: 3.0 - 4.0 standard drinks  Types: 3 - 4 Glasses of wine per week    Comment: 3-4/wk   Drug use: Never     Allergies   Patient has no known allergies.   Review of Systems Review of Systems  Respiratory:  Positive for cough.   All other systems reviewed and are negative.   Physical Exam Triage Vital Signs ED Triage Vitals  Enc Vitals Group     BP 08/30/20 1616 (!) 143/83     Pulse Rate 08/30/20 1616 94     Resp 08/30/20 1616 20     Temp 08/30/20 1616 99.5 F (37.5 C)     Temp Source 08/30/20 1616 Oral     SpO2 08/30/20 1616 98 %     Weight 08/30/20 1612 180 lb (81.6 kg)     Height 08/30/20 1612 5\' 2"  (1.575 m)     Head Circumference --      Peak Flow --      Pain Score 08/30/20 1612 0     Pain Loc --      Pain Edu? --      Excl. in GC? --    No data found.  Updated Vital Signs BP (!) 143/83   Pulse 94   Temp 99.5 F (37.5 C) (Oral)   Resp 20   Ht 5\' 2"  (1.575 m)   Wt 180 lb (81.6 kg)   SpO2 98%   BMI 32.92 kg/m   Physical Exam Vitals reviewed.  Constitutional:      General: She is not in acute distress.     Appearance: She is obese. She is not ill-appearing.  HENT:     Head: Normocephalic and atraumatic.     Right Ear: Tympanic membrane, ear canal and external ear normal.     Left Ear: Tympanic membrane, ear canal and external ear normal.     Nose: Nose normal.     Mouth/Throat:     Mouth: Mucous membranes are moist.     Pharynx: Oropharynx is clear.     Comments: Moderate amount of clear drainage of posterior oropharynx noted.  Eyes:     Extraocular Movements: Extraocular movements intact.     Conjunctiva/sclera: Conjunctivae normal.     Pupils: Pupils are equal, round, and reactive to light.  Cardiovascular:     Rate and Rhythm: Normal rate and regular rhythm.     Pulses: Normal pulses.     Heart sounds: Normal heart sounds.  Pulmonary:     Effort: Pulmonary effort is normal. No respiratory distress.     Breath sounds: Normal breath sounds. No stridor. No wheezing, rhonchi or rales.     Comments: Infrequent nonproductive cough noted on exam Musculoskeletal:        General: Normal range of motion.     Cervical back: Normal range of motion and neck supple. No tenderness.  Lymphadenopathy:     Cervical: No cervical adenopathy.  Skin:    General: Skin is warm and dry.  Neurological:     General: No focal deficit present.     Mental Status: She is alert and oriented to person, place, and time.  Psychiatric:        Mood and Affect: Mood normal.        Behavior: Behavior normal.     UC Treatments / Results  Labs (all labs ordered are listed, but only abnormal results are displayed) Labs Reviewed  POC SARS CORONAVIRUS 2 AG -  ED    EKG   Radiology  No results found.  Procedures Procedures (including critical care time)  Medications Ordered in UC Medications - No data to display  Initial Impression / Assessment and Plan / UC Course  I have reviewed the triage vital signs and the nursing notes.  Pertinent labs & imaging results that were available during my care of  the patient were reviewed by me and considered in my medical decision making (see chart for details).    MDM: 1.  Cough-advised patient to follow-up with PCP for further evaluation with possible advanced imaging and PFT, 2. Fever-rapid COVID-19 negative, patient refused COVID-19/flu A&B, 3.  Allergic rhinitis-advised OTC histamine blocker for the next 5 to 7 days, then as needed.  Patient discharged home, hemodynamically stable Final Clinical Impressions(s) / UC Diagnoses   Final diagnoses:  Cough  Fever, unspecified  Allergic rhinitis, unspecified seasonality, unspecified trigger     Discharge Instructions      Advised patient may take OTC histamine blocker (Zyrtec, Claritin, or Allegra) for allergic rhinitis/postnasal drip/drainage.  Encouraged/advised patient to follow-up with PCP for further evaluation of persistent chronic cough, possibly advanced imaging and/or PFT with Pulmonary consult.     ED Prescriptions   None    PDMP not reviewed this encounter.   Trevor Iha, FNP 08/30/20 1702

## 2020-08-30 NOTE — Discharge Instructions (Addendum)
Advised patient may take OTC histamine blocker (Zyrtec, Claritin, or Allegra) for allergic rhinitis/postnasal drip/drainage.  Encouraged/advised patient to follow-up with PCP for further evaluation of persistent chronic cough, possibly advanced imaging and/or PFT with Pulmonary consult.

## 2020-09-18 ENCOUNTER — Other Ambulatory Visit: Payer: Self-pay | Admitting: Osteopathic Medicine

## 2020-09-20 ENCOUNTER — Encounter (HOSPITAL_BASED_OUTPATIENT_CLINIC_OR_DEPARTMENT_OTHER): Payer: Self-pay | Admitting: *Deleted

## 2020-09-20 ENCOUNTER — Emergency Department (HOSPITAL_BASED_OUTPATIENT_CLINIC_OR_DEPARTMENT_OTHER): Payer: BC Managed Care – PPO

## 2020-09-20 ENCOUNTER — Emergency Department (HOSPITAL_BASED_OUTPATIENT_CLINIC_OR_DEPARTMENT_OTHER)
Admission: EM | Admit: 2020-09-20 | Discharge: 2020-09-20 | Disposition: A | Discharge status: Home / Self Care | Payer: BC Managed Care – PPO | Attending: Emergency Medicine | Admitting: Emergency Medicine

## 2020-09-20 ENCOUNTER — Other Ambulatory Visit: Payer: Self-pay

## 2020-09-20 DIAGNOSIS — R739 Hyperglycemia, unspecified: Secondary | ICD-10-CM

## 2020-09-20 DIAGNOSIS — Z79899 Other long term (current) drug therapy: Secondary | ICD-10-CM | POA: Insufficient documentation

## 2020-09-20 DIAGNOSIS — N12 Tubulo-interstitial nephritis, not specified as acute or chronic: Secondary | ICD-10-CM

## 2020-09-20 DIAGNOSIS — R509 Fever, unspecified: Secondary | ICD-10-CM | POA: Diagnosis not present

## 2020-09-20 DIAGNOSIS — I10 Essential (primary) hypertension: Secondary | ICD-10-CM | POA: Diagnosis not present

## 2020-09-20 DIAGNOSIS — E1165 Type 2 diabetes mellitus with hyperglycemia: Secondary | ICD-10-CM | POA: Diagnosis not present

## 2020-09-20 DIAGNOSIS — U071 COVID-19: Secondary | ICD-10-CM | POA: Diagnosis not present

## 2020-09-20 DIAGNOSIS — R059 Cough, unspecified: Secondary | ICD-10-CM | POA: Diagnosis not present

## 2020-09-20 LAB — BASIC METABOLIC PANEL
Anion gap: 12 (ref 5–15)
BUN: 10 mg/dL (ref 6–20)
CO2: 26 mmol/L (ref 22–32)
Calcium: 9 mg/dL (ref 8.9–10.3)
Chloride: 96 mmol/L — ABNORMAL LOW (ref 98–111)
Creatinine, Ser: 0.99 mg/dL (ref 0.44–1.00)
GFR, Estimated: >60 mL/min (ref >60)
Glucose, Bld: 172 mg/dL — ABNORMAL HIGH (ref 70–99)
Potassium: 3.2 mmol/L — ABNORMAL LOW (ref 3.5–5.1)
Sodium: 134 mmol/L — ABNORMAL LOW (ref 135–145)

## 2020-09-20 LAB — CBC WITH DIFFERENTIAL/PLATELET
Abs Immature Granulocytes: 0.06 10*3/uL (ref 0.00–0.07)
Basophils Absolute: 0 10*3/uL (ref 0.0–0.1)
Basophils Relative: 0 %
Eosinophils Absolute: 0 10*3/uL (ref 0.0–0.5)
Eosinophils Relative: 0 %
HCT: 44.8 % (ref 36.0–46.0)
Hemoglobin: 16 g/dL — ABNORMAL HIGH (ref 12.0–15.0)
Immature Granulocytes: 1 %
Lymphocytes Relative: 7 %
Lymphs Abs: 0.8 10*3/uL (ref 0.7–4.0)
MCH: 33.1 pg (ref 26.0–34.0)
MCHC: 35.7 g/dL (ref 30.0–36.0)
MCV: 92.6 fL (ref 80.0–100.0)
Monocytes Absolute: 1.1 10*3/uL — ABNORMAL HIGH (ref 0.1–1.0)
Monocytes Relative: 10 %
Neutro Abs: 8.7 10*3/uL — ABNORMAL HIGH (ref 1.7–7.7)
Neutrophils Relative %: 82 %
Platelets: 239 10*3/uL (ref 150–400)
RBC: 4.84 MIL/uL (ref 3.87–5.11)
RDW: 12 % (ref 11.5–15.5)
WBC: 10.6 10*3/uL — ABNORMAL HIGH (ref 4.0–10.5)
nRBC: 0 % (ref 0.0–0.2)

## 2020-09-20 LAB — RESP PANEL BY RT-PCR (FLU A&B, COVID) ARPGX2
Influenza A by PCR: NEGATIVE
Influenza B by PCR: NEGATIVE
SARS Coronavirus 2 by RT PCR: POSITIVE — AB

## 2020-09-20 LAB — URINALYSIS, MICROSCOPIC (REFLEX): WBC, UA: >50 WBC/hpf (ref 0–5)

## 2020-09-20 LAB — URINALYSIS, ROUTINE W REFLEX MICROSCOPIC
Glucose, UA: NEGATIVE mg/dL
Ketones, ur: >80 mg/dL — AB
Nitrite: POSITIVE — AB
Protein, ur: 100 mg/dL — AB
Specific Gravity, Urine: 1.015 (ref 1.005–1.030)
pH: 6 (ref 5.0–8.0)

## 2020-09-20 LAB — LACTIC ACID, PLASMA: Lactic Acid, Venous: 1.3 mmol/L (ref 0.5–1.9)

## 2020-09-20 MED ORDER — CEFPODOXIME PROXETIL 200 MG PO TABS: 200.0000 mg | ORAL_TABLET | Freq: Two times a day (BID) | ORAL | 0 refills | Status: DC

## 2020-09-20 MED ORDER — SODIUM CHLORIDE 0.9 % IV SOLN: INTRAVENOUS | Status: DC | PRN

## 2020-09-20 MED ORDER — SODIUM CHLORIDE 0.9 % IV SOLN
1.0000 g | Freq: Once | INTRAVENOUS | Status: AC
  Administered 2020-09-20: 1 g via INTRAVENOUS
  Filled 2020-09-20: qty 10

## 2020-09-20 MED ORDER — ACETAMINOPHEN 325 MG PO TABS
650.0000 mg | ORAL_TABLET | Freq: Once | ORAL | Status: AC
  Administered 2020-09-20: 650 mg via ORAL
  Filled 2020-09-20: qty 2

## 2020-09-20 MED ORDER — SODIUM CHLORIDE 0.9 % IV BOLUS
1000.0000 mL | Freq: Once | INTRAVENOUS | Status: AC
  Administered 2020-09-20: 1000 mL via INTRAVENOUS

## 2020-09-20 NOTE — Discharge Instructions (Addendum)
Follow-up with your primary care doctor regarding your elevated blood sugar.  Return here as needed if you have any worsening symptoms.

## 2020-09-20 NOTE — ED Provider Notes (Signed)
[facility] EMERGENCY DEPARTMENT Provider Note   CSN: [id number] Arrival date & time: 09/20/20  0746     History Chief Complaint  Patient presents with   Fever    Monica Mcdaniel is a 50 y.o. female.  Patient is a 50 year old female who presents with fevers.  She said that she has had some urinary symptoms such as tingling on urination and frequency for almost 2 weeks.  11 days ago she started running a fever and tested positive for COVID.  She had some mild respiratory symptoms.  She has had an ongoing cough for the last couple years but does not feel like it is any different.  3 days ago she started having increased symptoms with chills and fevers again and some worsening low back pain.  She says is an achy pain in her low back.  Its not worse with movement.  She denies any injuries to her back.  She had some nausea today but no vomiting.  She has been running fevers up to 102.  No change in stools.  No significant change in her chronic cough.  No sore throat.  She has been taking Aleve with some improvement in symptoms.      Past Medical History:  Diagnosis Date   Anxiety    GERD (gastroesophageal reflux disease)    Heart murmur    functional heart murmur--as a child   Hypertension    Iron deficiency anemia 07/22/2019    Patient Active Problem List   Diagnosis Date Noted   Iron deficiency anemia 07/22/2019   Gastroesophageal reflux disease 05/01/2018   Persistent cough 05/01/2018   Hyperglycemia 11/08/2017   Hypertension 10/10/2017    Past Surgical History:  Procedure Laterality Date   CHOLECYSTECTOMY     KNEE SURGERY Left    removal of benign tumor   REFRACTIVE SURGERY     TUBAL LIGATION       OB History     Gravida  4   Para  3   Term      Preterm      AB  1   Living  3      SAB      IAB      Ectopic      Multiple      Live Births              Family History  Problem Relation Age of Onset   High blood pressure Mother    Diabetes  Mother    Lung cancer Mother    Hypertension Mother    Cancer Father    Healthy Brother    Healthy Brother    Diabetes Maternal Grandmother    Stroke Maternal Grandmother    Thyroid disease Maternal Grandmother     Social History   Tobacco Use   Smoking status: Former    Packs/day: 1.00    Years: 7.00    Pack years: 7.00    Types: Cigarettes    Quit date: 1997    Years since quitting: 25.6   Smokeless tobacco: Former  Building services engineer Use: Never used  Substance Use Topics   Alcohol use: Yes    Alcohol/week: 3.0 - 4.0 standard drinks    Types: 3 - 4 Glasses of wine per week    Comment: 3-4/wk   Drug use: Never    Home Medications Prior to Admission medications   Medication Sig Start Date End Date Taking? Authorizing Provider  cefpodoxime (  VANTIN) 200 MG tablet Take 1 tablet (200 mg total) by mouth 2 (two) times daily. 09/20/20  Yes Rolan Bucco, MD  benazepril (LOTENSIN) 10 MG tablet Take 1 tablet (10 mg total) by mouth daily. 07/17/19   Sunnie Nielsen, DO  benzonatate (TESSALON) 100 MG capsule Take 1 capsule (100 mg total) by mouth every 8 (eight) hours. 07/27/20   Moshe Cipro, NP  Ferrous Sulfate (IRON PO) Take 1 tablet by mouth daily.    [provider]  hydrochlorothiazide (HYDRODIURIL) 25 MG tablet Take 1 tablet (25 mg total) by mouth daily. 07/17/19   Sunnie Nielsen, DO  naproxen sodium (ALEVE) 220 MG tablet Take 220 mg by mouth. Patient not taking: Reported on 07/27/2020    [provider]  omeprazole (PRILOSEC) 20 MG capsule TAKE 1 CAPSULE BY MOUTH EVERY DAY 07/17/19   Sunnie Nielsen, DO    Allergies    Patient has no known allergies.  Review of Systems   Review of Systems  Constitutional:  Positive for chills, fatigue and fever. Negative for diaphoresis.  HENT:  Negative for congestion, rhinorrhea and sneezing.   Eyes: Negative.   Respiratory:  Positive for cough. Negative for chest tightness and shortness of breath.    Cardiovascular:  Negative for chest pain and leg swelling.  Gastrointestinal:  Positive for nausea. Negative for abdominal pain, blood in stool, diarrhea and vomiting.  Genitourinary:  Positive for frequency. Negative for difficulty urinating, flank pain and hematuria.  Musculoskeletal:  Positive for back pain. Negative for arthralgias.  Skin:  Negative for rash.  Neurological:  Negative for dizziness, speech difficulty, weakness, numbness and headaches.   Physical Exam Updated Vital Signs BP (!) 121/56   Pulse 92   Temp 99.4 F (37.4 C) (Oral)   Resp 18   Ht 5\' 2"  (1.575 m)   Wt 81.2 kg   SpO2 97%   BMI 32.74 kg/m   Physical Exam Constitutional:      Appearance: She is well-developed.  HENT:     Head: Normocephalic and atraumatic.  Eyes:     Pupils: Pupils are equal, round, and reactive to light.  Cardiovascular:     Rate and Rhythm: Normal rate and regular rhythm.     Heart sounds: Normal heart sounds.  Pulmonary:     Effort: Pulmonary effort is normal. No respiratory distress.     Breath sounds: Normal breath sounds. No wheezing or rales.  Chest:     Chest wall: No tenderness.  Abdominal:     General: Bowel sounds are normal.     Palpations: Abdomen is soft.     Tenderness: There is no abdominal tenderness. There is no guarding or rebound.  Musculoskeletal:        General: Normal range of motion.     Cervical back: Normal range of motion and neck supple.     Comments: No reproducible tenderness on palpation of the back  Lymphadenopathy:     Cervical: No cervical adenopathy.  Skin:    General: Skin is warm and dry.     Findings: No rash.  Neurological:     Mental Status: She is alert and oriented to person, place, and time.    ED Results / Procedures / Treatments   Labs (all labs ordered are listed, but only abnormal results are displayed) Labs Reviewed  RESP PANEL BY RT-PCR (FLU A&B, COVID) ARPGX2 - Abnormal; Notable for the following components:       Result Value   SARS Coronavirus 2  by RT PCR POSITIVE (*)    All other components within normal limits  URINALYSIS, ROUTINE W REFLEX MICROSCOPIC - Abnormal; Notable for the following components:   APPearance CLOUDY (*)    Hgb urine dipstick LARGE (*)    Bilirubin Urine SMALL (*)    Ketones, ur >80 (*)    Protein, ur 100 (*)    Nitrite POSITIVE (*)    Leukocytes,Ua MODERATE (*)    All other components within normal limits  BASIC METABOLIC PANEL - Abnormal; Notable for the following components:   Sodium 134 (*)    Potassium 3.2 (*)    Chloride 96 (*)    Glucose, Bld 172 (*)    All other components within normal limits  CBC WITH DIFFERENTIAL/PLATELET - Abnormal; Notable for the following components:   WBC 10.6 (*)    Hemoglobin 16.0 (*)    Neutro Abs 8.7 (*)    Monocytes Absolute 1.1 (*)    All other components within normal limits  URINALYSIS, MICROSCOPIC (REFLEX) - Abnormal; Notable for the following components:   Bacteria, UA MANY (*)    All other components within normal limits  URINE CULTURE  LACTIC ACID, PLASMA    EKG None  Radiology DG Chest 2 View  Result Date: 09/20/2020 CLINICAL DATA:  Cough, fever, COVID positive EXAM: CHEST - 2 VIEW COMPARISON:  02/11/2018 FINDINGS: The heart size and mediastinal contours are within normal limits. Both lungs are clear. The visualized skeletal structures are unremarkable. IMPRESSION: No acute abnormality of the lungs. Electronically Signed   By: Lauralyn PrimesAlex  Bibbey M.D.   On: 09/20/2020 09:18    Procedures Procedures   Medications Ordered in ED Medications  0.9 %  sodium chloride infusion ( Intravenous Stopped 09/20/20 1141)  cefTRIAXone (ROCEPHIN) 1 g in sodium chloride 0.9 % 100 mL IVPB (0 g Intravenous Stopped 09/20/20 1138)  acetaminophen (TYLENOL) tablet 650 mg (650 mg Oral Given 09/20/20 1010)  sodium chloride 0.9 % bolus 1,000 mL (1,000 mLs Intravenous New Bag/Given 09/20/20 1143)    ED Course  I have reviewed the triage vital signs  and the nursing notes.  Pertinent labs & imaging results that were available during my care of the patient were reviewed by me and considered in my medical decision making (see chart for details).    MDM Rules/Calculators/A&P                           Patient is a 50 year old female who presents with urinary symptoms and back pain.  She is noted to be febrile.  Her white count is minimally elevated.  Her blood glucose is elevated and I did discuss with her following up with her PCP regarding this.  Her urine does appear infected.  Her COVID test is also positive.  Her chest x-ray is clear without evidence of pneumonia.  She has no hypoxia.  She is overall well-appearing.  There is no hypotension, elevated lactate or other symptoms that would be more concerning for sepsis.  She was mildly tachycardic on arrival but this improved with Tylenol for her fever and IV fluids.  She was given a dose of IV Rocephin.  Her urine was sent for culture.  She was discharged home in good condition.  She was started on Vantin.  She was encouraged to follow-up with her PCP about her elevated blood sugars.  Strict return precautions were given. Final Clinical Impression(s) / ED Diagnoses Final diagnoses:  Pyelonephritis  COVID-19 virus infection    Rx / DC Orders ED Discharge Orders          Ordered    cefpodoxime (VANTIN) 200 MG tablet  2 times daily        09/20/20 1226             Rolan Bucco, MD 09/20/20 1229

## 2020-09-20 NOTE — ED Notes (Signed)
Ambulated patient from waiting room to room 7. HR 125, RR 20, and SATs 95% on RA. Pt denies any SOB.

## 2020-09-20 NOTE — ED Triage Notes (Signed)
Has had a fever since Friday, also having some urinary incontinence as well.

## 2020-09-22 LAB — URINE CULTURE: Culture: >=100000 — AB

## 2020-09-23 ENCOUNTER — Telehealth: Payer: Self-pay

## 2020-09-23 NOTE — Telephone Encounter (Signed)
Post ED Visit - Positive Culture Follow-up  Culture report reviewed by antimicrobial stewardship pharmacist: [facility] [x]  , Pharm.D. []  Audie Clear, .D., BCPS AQ-ID []  Celedonio Miyamoto, Pharm.D., BCPS []  [address], Pharm.D., BCPS []  [place], Garvin Fila.D., BCPS, AAHIVP []  , Pharm.D., BCPS, AAHIVP []  Georgina Pillion, PharmD, BCPS []  , PharmD, BCPS []  [place], PharmD, BCPS []  [address], PharmD []  , PharmD, BCPS []  Estella Husk, PharmD  Pharmacy Team []  Lysle Pearl, PharmD []  , PharmD []  Phillips Climes, PharmD []  , Rph []  [employer]) , PharmD []  Verlan Friends, PharmD []  , PharmD []  Mervyn Gay, PharmD []  , PharmD []  Vinnie Level, PharmD []  [facility], PharmD []  , PharmD []  Len Childs, PharmD   Positive urine culture Treated with Cefpodoxime Proxetil, organism sensitive to the same and no further patient follow-up is required at this time.  09/23/2020, 11:06 AM

## 2020-09-27 ENCOUNTER — Other Ambulatory Visit: Payer: Self-pay | Admitting: Osteopathic Medicine

## 2020-10-08 ENCOUNTER — Other Ambulatory Visit: Payer: Self-pay

## 2020-10-08 ENCOUNTER — Encounter: Payer: Self-pay | Admitting: Osteopathic Medicine

## 2020-10-08 ENCOUNTER — Ambulatory Visit (INDEPENDENT_AMBULATORY_CARE_PROVIDER_SITE_OTHER): Payer: BC Managed Care – PPO | Admitting: Osteopathic Medicine

## 2020-10-08 VITALS — BP 118/77 | HR 78 | Temp 98.2°F | Wt 203.1 lb

## 2020-10-08 DIAGNOSIS — Z79899 Other long term (current) drug therapy: Secondary | ICD-10-CM

## 2020-10-08 DIAGNOSIS — I10 Essential (primary) hypertension: Secondary | ICD-10-CM | POA: Diagnosis not present

## 2020-10-08 DIAGNOSIS — Z23 Encounter for immunization: Secondary | ICD-10-CM

## 2020-10-08 DIAGNOSIS — R739 Hyperglycemia, unspecified: Secondary | ICD-10-CM

## 2020-10-08 DIAGNOSIS — D509 Iron deficiency anemia, unspecified: Secondary | ICD-10-CM

## 2020-10-08 DIAGNOSIS — Z1211 Encounter for screening for malignant neoplasm of colon: Secondary | ICD-10-CM

## 2020-10-08 DIAGNOSIS — Z0000 Encounter for general adult medical examination without abnormal findings: Secondary | ICD-10-CM | POA: Diagnosis not present

## 2020-10-08 DIAGNOSIS — N12 Tubulo-interstitial nephritis, not specified as acute or chronic: Secondary | ICD-10-CM

## 2020-10-08 MED ORDER — BENAZEPRIL HCL 10 MG PO TABS: 10.0000 mg | ORAL_TABLET | Freq: Every day | ORAL | 3 refills | Status: DC

## 2020-10-08 MED ORDER — OMEPRAZOLE 20 MG PO CPDR: DELAYED_RELEASE_CAPSULE | ORAL | 3 refills | Status: DC

## 2020-10-08 MED ORDER — HYDROCHLOROTHIAZIDE 25 MG PO TABS: 25.0000 mg | ORAL_TABLET | Freq: Every day | ORAL | 3 refills | Status: DC

## 2020-10-08 NOTE — Patient Instructions (Signed)
General Preventive Care Most recent routine screening labs: ordered.  Blood pressure goal 130/80 or less.  Tobacco: don't!  Alcohol: responsible moderation is ok for most adults - if you have concerns about your alcohol intake, please talk to me!  Exercise: as tolerated to reduce risk of cardiovascular disease and diabetes. Strength training will also prevent osteoporosis.  Mental health: if need for mental health care (medicines, counseling, other), or concerns about moods, please let me know!  Sexual / Reproductive health: if need for STD testing, or if concerns with libido/pain problems, please let me know!  Advanced Directive: Living Will and/or Healthcare Power of Attorney recommended for all adults, regardless of age or health.  Vaccines Flu vaccine: for almost everyone, every fall.  Shingles vaccine: after age 24.  Pneumonia vaccines: after age 77, or sooner if certain medical conditions. Tetanus booster: every 10 years, due 2026 COVID vaccine: THANKS for getting your vaccine! :)  Cancer screenings  Colon cancer screening: for everyone age 64-75. Colonoscopy available for all, many people also qualify for the Cologuard stool test  Breast cancer screening: mammogram annually or every other year after age 52  Cervical cancer screening: Pap per OBGYN  Lung cancer screening: not needed since quit more than 15 years ago  Infection screenings  HIV: recommended screening at least once age 4-65, more often as needed. Gonorrhea/Chlamydia: screening as needed Hepatitis C: recommended once for everyone age 1-75 TB: certain at-risk populations, or depending on work requirements and/or travel history Other Bone Density Test: recommended for women at age 78

## 2020-10-08 NOTE — Progress Notes (Signed)
Monica Mcdaniel is a 50 y.o. female who presents to  [facility] at [facility]  today, 10/08/20, seeking care for the following:  Annual physical  Follow up ER visit - Pyelonephritis and COVID-19, recovered well       ASSESSMENT & PLAN with other pertinent findings:  The primary encounter diagnosis was Annual physical exam. Diagnoses of Need for shingles vaccine, Need for influenza vaccination, Essential hypertension, Hyperglycemia, Iron deficiency anemia, unspecified iron deficiency anemia type, Current use of proton pump inhibitor, Pyelonephritis, and Colon cancer screening were also pertinent to this visit.   1. Annual physical exam See below Pap and Mammo via OBGYN   2. Need for shingles vaccine 3. Need for influenza vaccination Done today   4. Essential hypertension At goal   5. Hyperglycemia A1C pending   6. Iron deficiency anemia, unspecified iron deficiency anemia type Iron pending   7. Current use of proton pump inhibitor Checking B12 and Mg  8. Pyelonephritis resolved  9. Colon cancer screening Cologuars    Patient Instructions  General Preventive Care Most recent routine screening labs: ordered.  Blood pressure goal 130/80 or less.  Tobacco: don't!  Alcohol: responsible moderation is ok for most adults - if you have concerns about your alcohol intake, please talk to me!  Exercise: as tolerated to reduce risk of cardiovascular disease and diabetes. Strength training will also prevent osteoporosis.  Mental health: if need for mental health care (medicines, counseling, other), or concerns about moods, please let me know!  Sexual / Reproductive health: if need for STD testing, or if concerns with libido/pain problems, please let me know!  Advanced Directive: Living Will and/or Healthcare Power of Attorney recommended for all adults, regardless of age or health.  Vaccines Flu vaccine: for almost everyone, every fall.   Shingles vaccine: after age 80.  Pneumonia vaccines: after age 54, or sooner if certain medical conditions. Tetanus booster: every 10 years, due 2026 COVID vaccine: THANKS for getting your vaccine! :)  Cancer screenings  Colon cancer screening: for everyone age 57-75. Colonoscopy available for all, many people also qualify for the Cologuard stool test  Breast cancer screening: mammogram annually or every other year after age 27  Cervical cancer screening: Pap per OBGYN  Lung cancer screening: not needed since quit more than 15 years ago  Infection screenings  HIV: recommended screening at least once age 66-65, more often as needed. Gonorrhea/Chlamydia: screening as needed Hepatitis C: recommended once for everyone age 50-75 TB: certain at-risk populations, or depending on work requirements and/or travel history Other Bone Density Test: recommended for women at age 41  Orders Placed This Encounter  Procedures   Varicella-zoster vaccine IM (Shingrix)   Flu Vaccine QUAD 6+ mos PF IM (Fluarix Quad PF)   CBC   COMPLETE METABOLIC PANEL WITH GFR   Lipid panel   Hemoglobin A1c   Vitamin B12   Magnesium   Fe+TIBC+Fer   Cologuard    Meds ordered this encounter  Medications   benazepril (LOTENSIN) 10 MG tablet    Sig: Take 1 tablet (10 mg total) by mouth daily.    Dispense:  90 tablet    Refill:  3   hydrochlorothiazide (HYDRODIURIL) 25 MG tablet    Sig: Take 1 tablet (25 mg total) by mouth daily.    Dispense:  90 tablet    Refill:  3   omeprazole (PRILOSEC) 20 MG capsule    Sig: TAKE 1 CAPSULE BY MOUTH  EVERY DAY.    Dispense:  90 capsule    Refill:  3     See below for relevant physical exam findings  See below for recent lab and imaging results reviewed  Medications, allergies, PMH, PSH, SocH, FamH reviewed below    Follow-up instructions: Return in about 1 year (around 10/08/2021) for ROUTINE ANNUAL PHYSICAL (OR SOONER IF NEEDED). NURSE VISIT 2-6 MOS SHINGRIX  #2.                                        Exam:  BP 118/77 (BP Location: Left Arm, Patient Position: Sitting, Cuff Size: Large)   Pulse 78   Temp 98.2 F (36.8 C) (Oral)   Wt 203 lb 1.3 oz (92.1 kg)   BMI 37.14 kg/m  Constitutional: VS see above. General Appearance: alert, well-developed, well-nourished, NAD Neck: No masses, trachea midline.  Respiratory: Normal respiratory effort. no wheeze, no rhonchi, no rales Cardiovascular: S1/S2 normal, no murmur, no rub/gallop auscultated. RRR.  Musculoskeletal: Gait normal. Symmetric and independent movement of all extremities Abdominal: non-tender, non-distended, no appreciable organomegaly, neg Murphy's, BS WNLx4 Neurological: Normal balance/coordination. No tremor. Skin: warm, dry, intact.  Psychiatric: Normal judgment/insight. Normal mood and affect. Oriented x3.   Current Meds  Medication Sig   [DISCONTINUED] benazepril (LOTENSIN) 10 MG tablet Take 1 tablet (10 mg total) by mouth daily. NO REFILLS. NEEDS AN APPT.   [DISCONTINUED] hydrochlorothiazide (HYDRODIURIL) 25 MG tablet Take 1 tablet (25 mg total) by mouth daily. NO REFILLS. NEEDS AN APPT.   [DISCONTINUED] omeprazole (PRILOSEC) 20 MG capsule TAKE 1 CAPSULE BY MOUTH EVERY DAY. NO REFILLS. NEEDS AN APPT.    No Known Allergies  Patient Active Problem List   Diagnosis Date Noted   Iron deficiency anemia 07/22/2019   Gastroesophageal reflux disease 05/01/2018   Persistent cough 05/01/2018   Hyperglycemia 11/08/2017   Hypertension 10/10/2017    Family History  Problem Relation Age of Onset   High blood pressure Mother    Diabetes Mother    Lung cancer Mother    Hypertension Mother    Cancer Father    Healthy Brother    Healthy Brother    Diabetes Maternal Grandmother    Stroke Maternal Grandmother    Thyroid disease Maternal Grandmother     Social History   Tobacco Use  Smoking Status Former   Packs/day: 1.00   Years: 7.00    Pack years: 7.00   Types: Cigarettes   Quit date: 1997   Years since quitting: 25.6  Smokeless Tobacco Former    Past Surgical History:  Procedure Laterality Date   CHOLECYSTECTOMY     KNEE SURGERY Left    removal of benign tumor   REFRACTIVE SURGERY     TUBAL LIGATION      Immunization History  Administered Date(s) Administered   Influenza,inj,Quad PF,6+ Mos 01/16/2019, 10/03/2019, 10/08/2020   PFIZER(Purple Top)SARS-COV-2 Vaccination 04/23/2019, 05/21/2019, 02/01/2020   Tdap 09/18/2014   Zoster Recombinat (Shingrix) 10/08/2020    Recent Results (from the past 2160 hour(s))  POC SARS Coronavirus 2 Ag-ED - Nasal Swab     Status: None   Collection Time: 08/30/20  4:47 PM  Result Value Ref Range   SARS Coronavirus 2 Ag Negative Negative  Urinalysis, Routine w reflex microscopic Urine, Clean Catch     Status: Abnormal   Collection Time: 09/20/20  8:30 AM  Result Value Ref  Range   Color, Urine YELLOW YELLOW   APPearance CLOUDY (A) CLEAR   Specific Gravity, Urine 1.015 1.005 - 1.030   pH 6.0 5.0 - 8.0   Glucose, UA NEGATIVE NEGATIVE mg/dL   Hgb urine dipstick LARGE (A) NEGATIVE   Bilirubin Urine SMALL (A) NEGATIVE   Ketones, ur >80 (A) NEGATIVE mg/dL   Protein, ur 559 (A) NEGATIVE mg/dL   Nitrite POSITIVE (A) NEGATIVE   Leukocytes,Ua MODERATE (A) NEGATIVE    Comment: Performed at [facility], [address] [place], [place] [address]  Urinalysis, Microscopic (reflex)     Status: Abnormal   Collection Time: 09/20/20  8:30 AM  Result Value Ref Range   RBC / HPF 21-50 0 - 5 RBC/hpf   WBC, UA >50 0 - 5 WBC/hpf   Bacteria, UA MANY (A) NONE SEEN   Squamous Epithelial / LPF 0-5 0 - 5   Mucus PRESENT     Comment: Performed at [facility], [address] [place], [place] [address]  Urine Culture     Status: Abnormal   Collection Time: 09/20/20  8:30 AM   Specimen: Urine, Clean Catch  Result Value Ref Range   Specimen Description      URINE, CLEAN  CATCH Performed at [facility], [address] [place], [place] [address]    Special Requests      NONE Performed at [facility], [address] [place], [place] [address]    Culture >=100,000 COLONIES/mL ESCHERICHIA COLI (A)    Report Status 09/22/2020 FINAL    Organism ID, Bacteria ESCHERICHIA COLI (A)       Susceptibility   Escherichia coli - MIC*    AMPICILLIN <=2 SENSITIVE Sensitive     CEFAZOLIN <=4 SENSITIVE Sensitive     CEFEPIME <=0.12 SENSITIVE Sensitive     CEFTRIAXONE <=0.25 SENSITIVE Sensitive     CIPROFLOXACIN <=0.25 SENSITIVE Sensitive     GENTAMICIN <=1 SENSITIVE Sensitive     IMIPENEM <=0.25 SENSITIVE Sensitive     NITROFURANTOIN <=16 SENSITIVE Sensitive     TRIMETH/SULFA <=20 SENSITIVE Sensitive     AMPICILLIN/SULBACTAM <=2 SENSITIVE Sensitive     PIP/TAZO <=4 SENSITIVE Sensitive     * >=100,000 COLONIES/mL ESCHERICHIA COLI  Basic metabolic panel     Status: Abnormal   Collection Time: 09/20/20  9:06 AM  Result Value Ref Range   Sodium 134 (L) 135 - 145 mmol/L   Potassium 3.2 (L) 3.5 - 5.1 mmol/L   Chloride 96 (L) 98 - 111 mmol/L   CO2 26 22 - 32 mmol/L   Glucose, Bld 172 (H) 70 - 99 mg/dL    Comment: Glucose reference range applies only to samples taken after fasting for at least 8 hours.   BUN 10 6 - 20 mg/dL   Creatinine, Ser 8.25 0.44 - 1.00 mg/dL   Calcium 9.0 8.9 - 00.3 mg/dL   GFR, Estimated >70 >48 mL/min    Comment: (NOTE) Calculated using the CKD-EPI Creatinine Equation (2021)    Anion gap 12 5 - 15    Comment: Performed at [facility], [address] [place], [place] [address]  CBC with Differential     Status: Abnormal   Collection Time: 09/20/20  9:06 AM  Result Value Ref Range   WBC 10.6 (H) 4.0 - 10.5 K/uL   RBC 4.84 3.87 - 5.11 MIL/uL   Hemoglobin 16.0 (H) 12.0 - 15.0  g/dL   HCT 29.5 28.4 - 13.2 %   MCV 92.6 80.0 - 100.0 fL   MCH 33.1 26.0 - 34.0 pg   MCHC 35.7 30.0 - 36.0 g/dL   RDW 44.0 10.2  - 72.5 %   Platelets 239 150 - 400 K/uL   nRBC 0.0 0.0 - 0.2 %   Neutrophils Relative % 82 %   Neutro Abs 8.7 (H) 1.7 - 7.7 K/uL   Lymphocytes Relative 7 %   Lymphs Abs 0.8 0.7 - 4.0 K/uL   Monocytes Relative 10 %   Monocytes Absolute 1.1 (H) 0.1 - 1.0 K/uL   Eosinophils Relative 0 %   Eosinophils Absolute 0.0 0.0 - 0.5 K/uL   Basophils Relative 0 %   Basophils Absolute 0.0 0.0 - 0.1 K/uL   Immature Granulocytes 1 %   Abs Immature Granulocytes 0.06 0.00 - 0.07 K/uL    Comment: Performed at [facility], [address] [place], [place] [address]  Resp Panel by RT-PCR (Flu A&B, Covid) Nasopharyngeal Swab     Status: Abnormal   Collection Time: 09/20/20  9:06 AM   Specimen: Nasopharyngeal Swab; Nasopharyngeal(NP) swabs in vial transport medium  Result Value Ref Range   SARS Coronavirus 2 by RT PCR POSITIVE (A) NEGATIVE    Comment: RESULT CALLED TO, READ BACK BY AND VERIFIED WITHHeywood Bene RN [id number] PHILLIPS C (NOTE) SARS-CoV-2 target nucleic acids are DETECTED.  The SARS-CoV-2 RNA is generally detectable in upper respiratory specimens during the acute phase of infection. Positive results are indicative of the presence of the identified virus, but do not rule out bacterial infection or co-infection with other pathogens not detected by the test. Clinical correlation with patient history and other diagnostic information is necessary to determine patient infection status. The expected result is Negative.  Fact Sheet for Patients: BloggerCourse.com  Fact Sheet for Healthcare Providers: SeriousBroker.it  This test is not yet approved or cleared by the [place] FDA and  has been authorized for detection and/or diagnosis of SARS-CoV-2 by FDA under an Emergency Use Authorization (EUA).  This EUA will remain in effect (meaning this test can be  used) for the duration of  the COVID-19 declaration under Section  564(b)(1) of the Act, 21 U.S.C. section 360bbb-3(b)(1), unless the authorization is terminated or revoked sooner.     Influenza A by PCR NEGATIVE NEGATIVE   Influenza B by PCR NEGATIVE NEGATIVE    Comment: (NOTE) The Xpert Xpress SARS-CoV-2/FLU/RSV plus assay is intended as an aid in the diagnosis of influenza from Nasopharyngeal swab specimens and should not be used as a sole basis for treatment. Nasal washings and aspirates are unacceptable for Xpert Xpress SARS-CoV-2/FLU/RSV testing.  Fact Sheet for Patients: BloggerCourse.com  Fact Sheet for Healthcare Providers: SeriousBroker.it  This test is not yet approved or cleared by the [place] FDA and has been authorized for detection and/or diagnosis of SARS-CoV-2 by FDA under an Emergency Use Authorization (EUA). This EUA will remain in effect (meaning this test can be used) for the duration of the COVID-19 declaration under Section 564(b)(1) of the Act, 21 U.S.C. section 360bbb-3(b)(1), unless the authorization is terminated or revoked.  Performed at [facility], [address] [place], [place] [address]   Lactic acid, plasma     Status: None   Collection Time: 09/20/20 10:09 AM  Result Value Ref Range   Lactic Acid, Venous 1.3 0.5 - 1.9 mmol/L    Comment: Performed  at [facility] [place], [address] [place], [place] [address]    No results found.     All questions at time of visit were answered - patient instructed to contact office with any additional concerns or updates. ER/RTC precautions were reviewed with the patient as applicable.   Please note: manual typing as well as voice recognition software may have been used to produce this document - typos may escape review. Please contact Dr. Lyn HollingsheadAlexander for any needed clarifications.

## 2020-10-09 LAB — COMPLETE METABOLIC PANEL WITH GFR
AG Ratio: 1.7 (calc) (ref 1.0–2.5)
ALT: 59 U/L — ABNORMAL HIGH (ref 6–29)
AST: 34 U/L (ref 10–35)
Albumin: 4.5 g/dL (ref 3.6–5.1)
Alkaline phosphatase (APISO): 73 U/L (ref 37–153)
BUN: 14 mg/dL (ref 7–25)
CO2: 29 mmol/L (ref 20–32)
Calcium: 9.7 mg/dL (ref 8.6–10.4)
Chloride: 100 mmol/L (ref 98–110)
Creat: 0.79 mg/dL (ref 0.50–1.03)
Globulin: 2.7 g/dL (calc) (ref 1.9–3.7)
Glucose, Bld: 93 mg/dL (ref 65–99)
Potassium: 3.9 mmol/L (ref 3.5–5.3)
Sodium: 138 mmol/L (ref 135–146)
Total Bilirubin: 0.6 mg/dL (ref 0.2–1.2)
Total Protein: 7.2 g/dL (ref 6.1–8.1)
eGFR: 91 mL/min/{1.73_m2} (ref >=60)

## 2020-10-09 LAB — MAGNESIUM: Magnesium: 2.1 mg/dL (ref 1.5–2.5)

## 2020-10-09 LAB — LIPID PANEL
Cholesterol: 193 mg/dL (ref <200)
HDL: 64 mg/dL (ref >=50)
LDL Cholesterol (Calc): 107 mg/dL (calc) — ABNORMAL HIGH
Non-HDL Cholesterol (Calc): 129 mg/dL (calc) (ref <130)
Total CHOL/HDL Ratio: 3 (calc) (ref <5.0)
Triglycerides: 123 mg/dL (ref <150)

## 2020-10-09 LAB — CBC
HCT: 46.1 % — ABNORMAL HIGH (ref 35.0–45.0)
Hemoglobin: 15.5 g/dL (ref 11.7–15.5)
MCH: 32.6 pg (ref 27.0–33.0)
MCHC: 33.6 g/dL (ref 32.0–36.0)
MCV: 96.8 fL (ref 80.0–100.0)
MPV: 9.5 fL (ref 7.5–12.5)
Platelets: 337 10*3/uL (ref 140–400)
RBC: 4.76 10*6/uL (ref 3.80–5.10)
RDW: 12.5 % (ref 11.0–15.0)
WBC: 7.6 10*3/uL (ref 3.8–10.8)

## 2020-10-09 LAB — HEMOGLOBIN A1C
Hgb A1c MFr Bld: 5.4 % of total Hgb (ref <5.7)
Mean Plasma Glucose: 108 mg/dL
eAG (mmol/L): 6 mmol/L

## 2020-10-09 LAB — IRON,TIBC AND FERRITIN PANEL
%SAT: 26 % (calc) (ref 16–45)
Ferritin: 191 ng/mL (ref 16–232)
Iron: 89 ug/dL (ref 45–160)
TIBC: 338 mcg/dL (calc) (ref 250–450)

## 2020-10-09 LAB — VITAMIN B12: Vitamin B-12: 600 pg/mL (ref 200–1100)

## 2020-10-30 LAB — COLOGUARD: Cologuard: NEGATIVE

## 2020-12-22 ENCOUNTER — Ambulatory Visit: Payer: BC Managed Care – PPO

## 2021-02-13 IMAGING — MG DIGITAL SCREENING BILAT W/ TOMO W/ CAD
6 of 10 series · 6 of 30 positions shown · non-contrast
Comparison: Previous exam(s).

CLINICAL DATA: Screening.

EXAM:
DIGITAL SCREENING BILATERAL MAMMOGRAM WITH TOMO AND CAD

[L CC synth-2D]
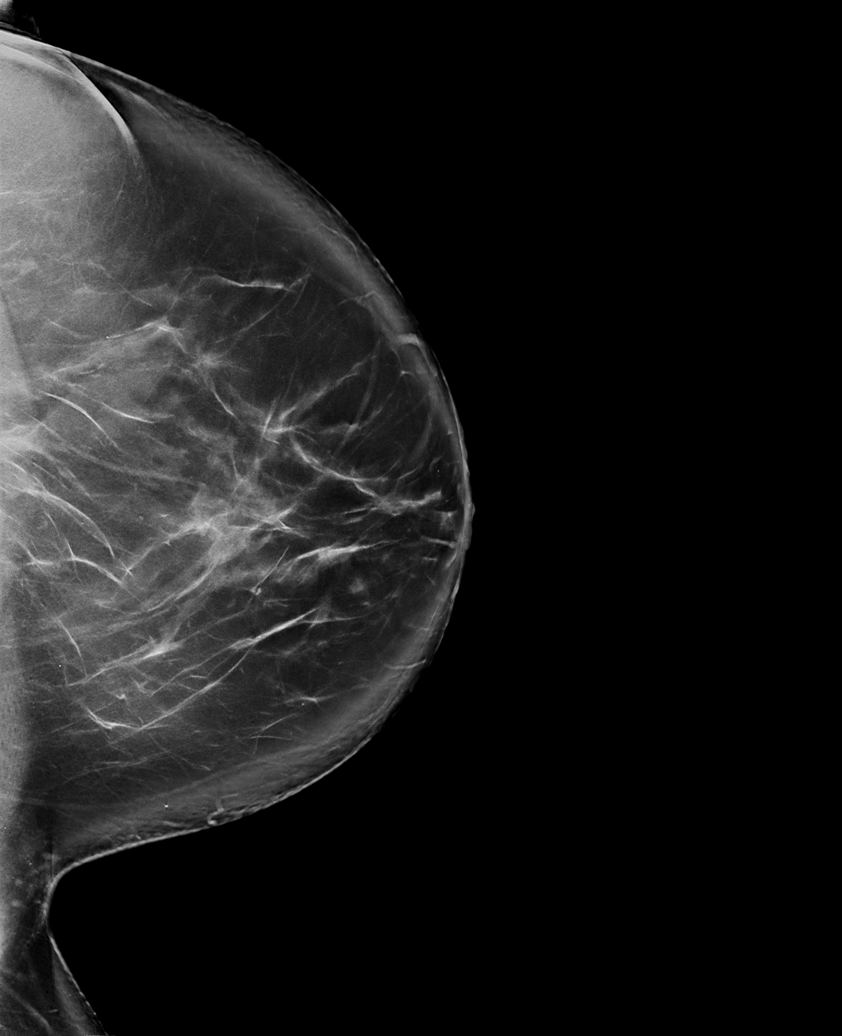

[R MLO synth-2D]
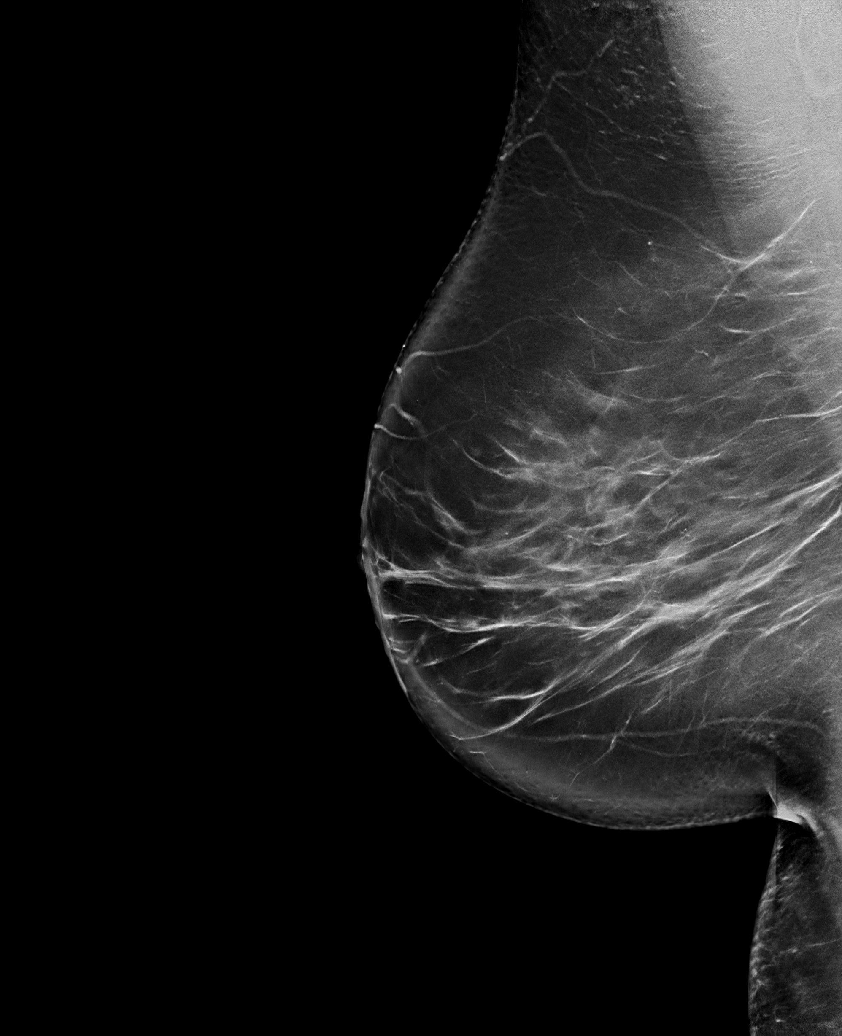

[R CC synth-2D]
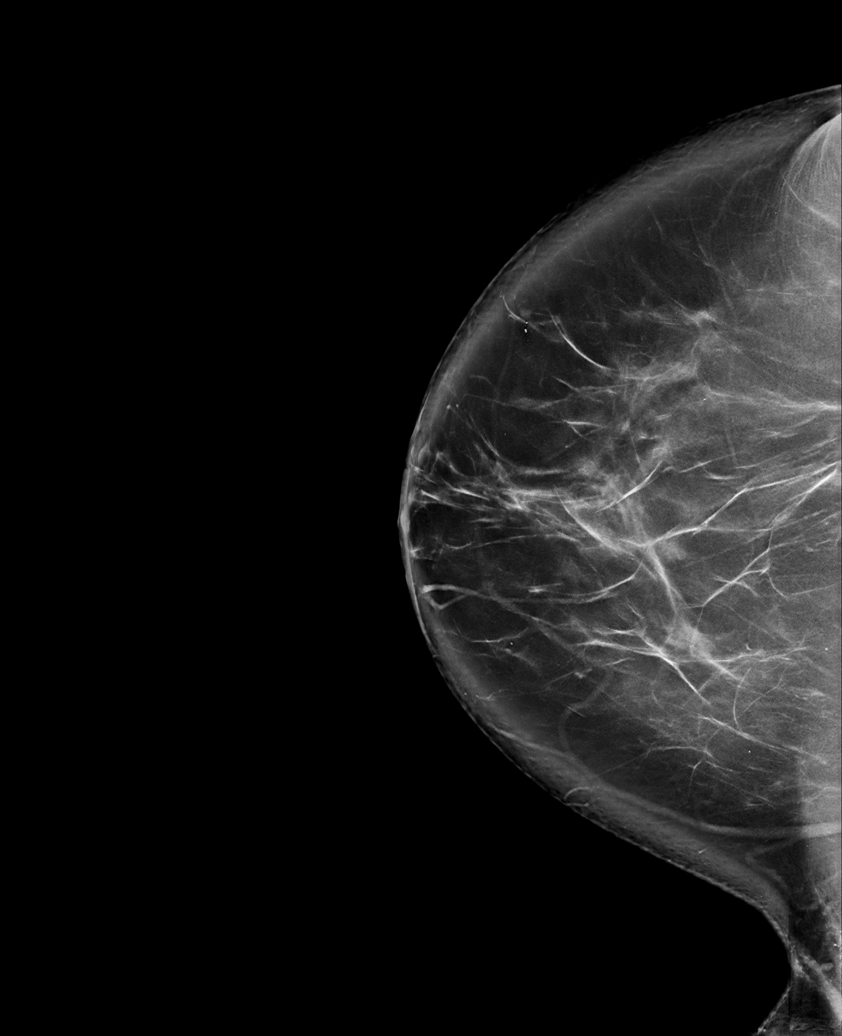

[R XCCL synth-2D]
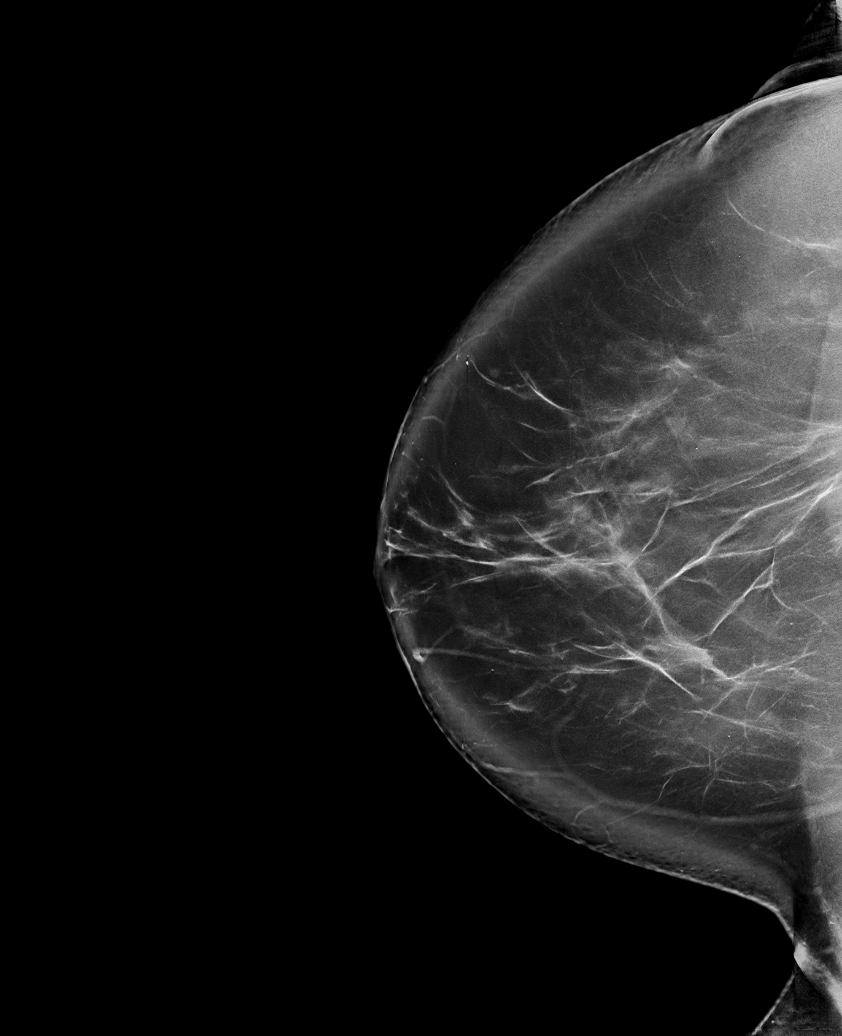

[L MLO synth-2D]
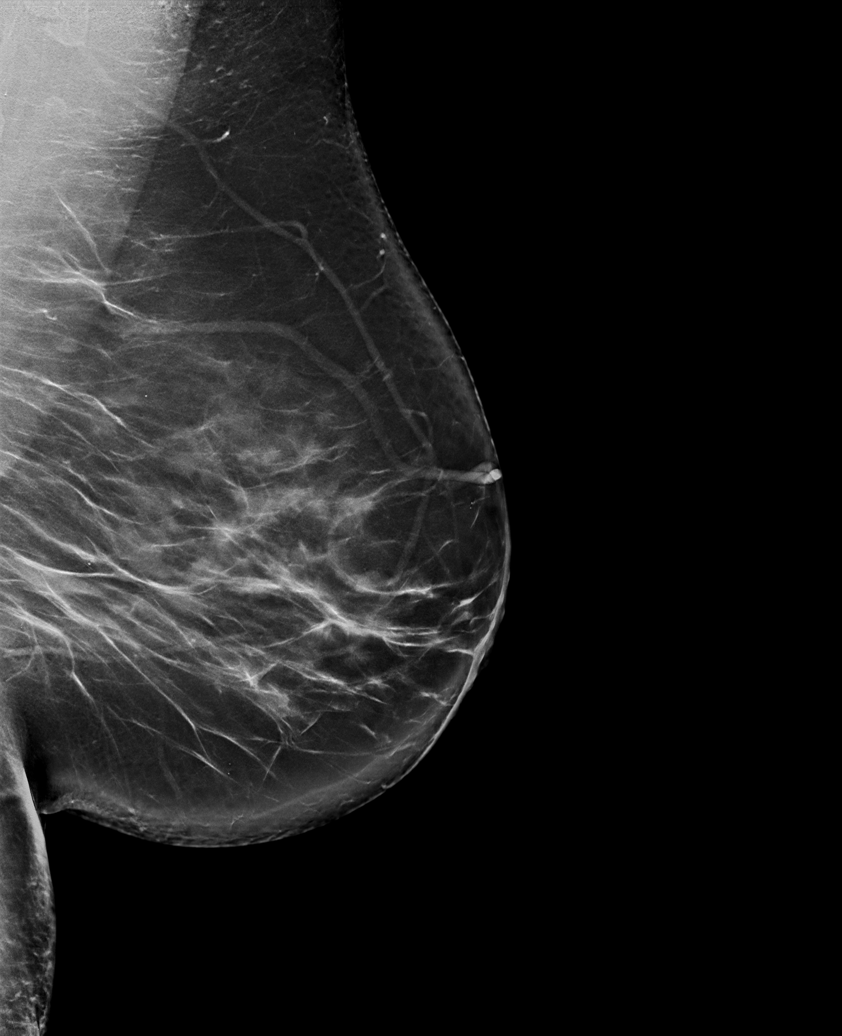

[L CC tomo · tomo slice 55/110.0]
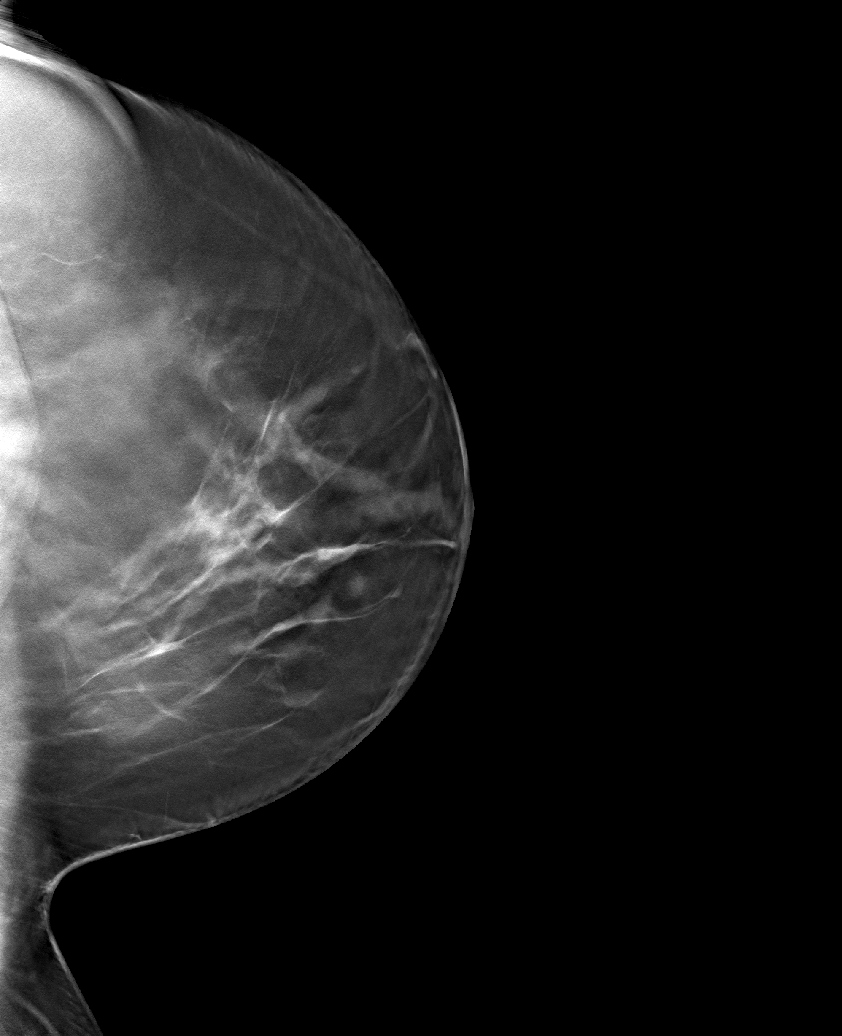

[6 of 30 positions shown; findings below may reference images not displayed]

ACR Breast Density Category b: There are scattered areas of
fibroglandular density.
FINDINGS: There are no findings suspicious for malignancy. Images were
processed with CAD.
IMPRESSION: No mammographic evidence of malignancy. A result letter of this
screening mammogram will be mailed directly to the patient.

RECOMMENDATION:
Screening mammogram in one year. (Code:[id number])

BI-RADS CATEGORY  1: Negative.

## 2021-11-24 ENCOUNTER — Ambulatory Visit: Payer: BC Managed Care – PPO | Admitting: Family Medicine

## 2021-11-24 ENCOUNTER — Encounter: Payer: Self-pay | Admitting: Family Medicine

## 2021-11-24 VITALS — BP 137/74 | HR 84 | Ht 62.0 in | Wt 205.0 lb

## 2021-11-24 DIAGNOSIS — I10 Essential (primary) hypertension: Secondary | ICD-10-CM | POA: Diagnosis not present

## 2021-11-24 DIAGNOSIS — K219 Gastro-esophageal reflux disease without esophagitis: Secondary | ICD-10-CM | POA: Diagnosis not present

## 2021-11-24 DIAGNOSIS — Z23 Encounter for immunization: Secondary | ICD-10-CM

## 2021-11-24 DIAGNOSIS — Z5181 Encounter for therapeutic drug level monitoring: Secondary | ICD-10-CM

## 2021-11-24 MED ORDER — OMEPRAZOLE 20 MG PO CPDR: DELAYED_RELEASE_CAPSULE | ORAL | 3 refills | Status: DC

## 2021-11-24 MED ORDER — HYDROCHLOROTHIAZIDE 25 MG PO TABS: 25.0000 mg | ORAL_TABLET | Freq: Every day | ORAL | 3 refills | Status: DC

## 2021-11-24 MED ORDER — BENAZEPRIL HCL 10 MG PO TABS: 10.0000 mg | ORAL_TABLET | Freq: Every day | ORAL | 3 refills | Status: DC

## 2021-11-24 NOTE — Assessment & Plan Note (Signed)
-   have ordered A1C to assess diabetic status  - discussed diet and exercise

## 2021-11-24 NOTE — Progress Notes (Signed)
Established patient visit   Patient: Monica Mcdaniel   DOB: 18-Dec-1970   51 y.o. Female  MRN: [id number] Visit Date: 11/24/2021  Today's healthcare provider: Owens Loffler, DO   Chief Complaint  Patient presents with   [place]    Chief Complaint  Patient presents with   Establish Care   HPI  Pt presents today to establish care. She has a pmh of HTN and is currently on benazepril 10mg  and hctz 25mg . She has been tolerating medication well. BP well controlled today.   She also has a pmh of GERD and takes omeprazole 20mg . She takes this medication appropriately.   Review of Systems  Constitutional:  Negative for activity change, fatigue and fever.  Respiratory:  Negative for cough and shortness of breath.   Cardiovascular:  Negative for chest pain.  Gastrointestinal:  Negative for abdominal pain.  Genitourinary:  Negative for difficulty urinating.       Current Meds  Medication Sig   [DISCONTINUED] benazepril (LOTENSIN) 10 MG tablet Take 1 tablet (10 mg total) by mouth daily.   [DISCONTINUED] hydrochlorothiazide (HYDRODIURIL) 25 MG tablet Take 1 tablet (25 mg total) by mouth daily.   [DISCONTINUED] omeprazole (PRILOSEC) 20 MG capsule TAKE 1 CAPSULE BY MOUTH EVERY DAY.    OBJECTIVE    BP 137/74   Pulse 84   Ht 5\' 2"  (1.575 m)   Wt 205 lb (93 kg)   SpO2 100%   BMI 37.49 kg/m   Physical Exam Vitals and nursing note reviewed.  Constitutional:      General: She is not in acute distress.    Appearance: Normal appearance.  HENT:     Head: Normocephalic and atraumatic.     Right Ear: External ear normal.     Left Ear: External ear normal.     Nose: Nose normal.  Eyes:     Conjunctiva/sclera: Conjunctivae normal.  Cardiovascular:     Rate and Rhythm: Normal rate and regular rhythm.  Pulmonary:     Effort: Pulmonary effort is normal.     Breath sounds: Normal breath sounds.  Neurological:     General: No focal deficit present.     Mental  Status: She is alert and oriented to person, place, and time.  Psychiatric:        Mood and Affect: Mood normal.        Behavior: Behavior normal.        Thought Content: Thought content normal.        Judgment: Judgment normal.        ASSESSMENT & PLAN    Problem List Items Addressed This Visit       Cardiovascular and Mediastinum   Hypertension - Primary    - continue benazapril and hctz  - will order CMP to check electrolyte and kidney function  - bp well controlled today. Can follow up in 6 months - also ordered microalbumin/cr ratio to check for proteinuria. - discussed diet and exercise      Relevant Medications   benazepril (LOTENSIN) 10 MG tablet   hydrochlorothiazide (HYDRODIURIL) 25 MG tablet   Other Relevant Orders   COMPLETE METABOLIC PANEL WITH GFR   CBC   Lipid panel   Microalbumin / creatinine urine ratio     Digestive   Gastroesophageal reflux disease    - have ordered Mag level and Vit D to monitor for deficiencies secondary to omeprazole  - refilled omeprazole as it has  been working well for her.       Relevant Medications   omeprazole (PRILOSEC) 20 MG capsule   Other Relevant Orders   Magnesium   Vitamin D (25 hydroxy)     Other   Morbid obesity ([place])    - have ordered A1C to assess diabetic status  - discussed diet and exercise       Relevant Orders   Lipid panel   HgB A1c   Other Visit Diagnoses     Medication monitoring encounter       Relevant Orders   Magnesium       Return in about 6 months (around 05/26/2022).      Meds ordered this encounter  Medications   benazepril (LOTENSIN) 10 MG tablet    Sig: Take 1 tablet (10 mg total) by mouth daily.    Dispense:  90 tablet    Refill:  3   hydrochlorothiazide (HYDRODIURIL) 25 MG tablet    Sig: Take 1 tablet (25 mg total) by mouth daily.    Dispense:  90 tablet    Refill:  3   omeprazole (PRILOSEC) 20 MG capsule    Sig: TAKE 1 CAPSULE BY MOUTH EVERY DAY.    Dispense:  90  capsule    Refill:  3    Orders Placed This Encounter  Procedures   COMPLETE METABOLIC PANEL WITH GFR   CBC   Lipid panel    Order Specific Question:   Has the patient fasted?    Answer:   No    Order Specific Question:   Release to patient    Answer:   Immediate   Microalbumin / creatinine urine ratio   Magnesium   Vitamin D (25 hydroxy)   HgB A1c     Owens Loffler, DO  [facility] [phone] (phone) [phone] (fax)  [place]

## 2021-11-24 NOTE — Assessment & Plan Note (Addendum)
-   continue benazapril and hctz  - will order CMP to check electrolyte and kidney function  - bp well controlled today. Can follow up in 6 months - also ordered microalbumin/cr ratio to check for proteinuria. - discussed diet and exercise

## 2021-11-24 NOTE — Addendum Note (Signed)
Addended by: Cline Crock on: 11/24/2021 11:26 AM   Modules accepted: Orders

## 2021-11-24 NOTE — Assessment & Plan Note (Signed)
-   have ordered Mag level and Vit D to monitor for deficiencies secondary to omeprazole  - refilled omeprazole as it has been working well for her.

## 2021-11-25 LAB — COMPLETE METABOLIC PANEL WITH GFR
AG Ratio: 1.6 (calc) (ref 1.0–2.5)
ALT: 96 U/L — ABNORMAL HIGH (ref 6–29)
AST: 69 U/L — ABNORMAL HIGH (ref 10–35)
Albumin: 4.5 g/dL (ref 3.6–5.1)
Alkaline phosphatase (APISO): 96 U/L (ref 37–153)
BUN: 15 mg/dL (ref 7–25)
CO2: 27 mmol/L (ref 20–32)
Calcium: 9.8 mg/dL (ref 8.6–10.4)
Chloride: 100 mmol/L (ref 98–110)
Creat: 0.75 mg/dL (ref 0.50–1.03)
Globulin: 2.8 g/dL (calc) (ref 1.9–3.7)
Glucose, Bld: 158 mg/dL — ABNORMAL HIGH (ref 65–99)
Potassium: 4 mmol/L (ref 3.5–5.3)
Sodium: 140 mmol/L (ref 135–146)
Total Bilirubin: 1.1 mg/dL (ref 0.2–1.2)
Total Protein: 7.3 g/dL (ref 6.1–8.1)
eGFR: 96 mL/min/{1.73_m2} (ref >=60)

## 2021-11-25 LAB — CBC
HCT: 45.7 % — ABNORMAL HIGH (ref 35.0–45.0)
Hemoglobin: 16.4 g/dL — ABNORMAL HIGH (ref 11.7–15.5)
MCH: 32.9 pg (ref 27.0–33.0)
MCHC: 35.9 g/dL (ref 32.0–36.0)
MCV: 91.8 fL (ref 80.0–100.0)
MPV: 9.8 fL (ref 7.5–12.5)
Platelets: 300 10*3/uL (ref 140–400)
RBC: 4.98 10*6/uL (ref 3.80–5.10)
RDW: 11.4 % (ref 11.0–15.0)
WBC: 6 10*3/uL (ref 3.8–10.8)

## 2021-11-25 LAB — LIPID PANEL
Cholesterol: 176 mg/dL (ref <200)
HDL: 66 mg/dL (ref >=50)
LDL Cholesterol (Calc): 93 mg/dL (calc)
Non-HDL Cholesterol (Calc): 110 mg/dL (calc) (ref <130)
Total CHOL/HDL Ratio: 2.7 (calc) (ref <5.0)
Triglycerides: 81 mg/dL (ref <150)

## 2021-11-25 LAB — MICROALBUMIN / CREATININE URINE RATIO
Creatinine, Urine: 247 mg/dL (ref 20–275)
Microalb Creat Ratio: 5 mcg/mg creat (ref <30)
Microalb, Ur: 1.3 mg/dL

## 2021-11-25 LAB — HEMOGLOBIN A1C
Hgb A1c MFr Bld: 6.8 % of total Hgb — ABNORMAL HIGH (ref <5.7)
Mean Plasma Glucose: 148 mg/dL
eAG (mmol/L): 8.2 mmol/L

## 2021-11-25 LAB — VITAMIN D 25 HYDROXY (VIT D DEFICIENCY, FRACTURES): Vit D, 25-Hydroxy: 12 ng/mL — ABNORMAL LOW (ref 30–100)

## 2021-11-25 LAB — MAGNESIUM: Magnesium: 2.1 mg/dL (ref 1.5–2.5)

## 2021-11-26 ENCOUNTER — Other Ambulatory Visit: Payer: Self-pay | Admitting: Family Medicine

## 2021-11-26 DIAGNOSIS — E559 Vitamin D deficiency, unspecified: Secondary | ICD-10-CM

## 2021-11-26 DIAGNOSIS — R7401 Elevation of levels of liver transaminase levels: Secondary | ICD-10-CM

## 2021-11-26 MED ORDER — VITAMIN D3 25 MCG (1000 UT) PO CAPS: 1000.0000 [IU] | ORAL_CAPSULE | Freq: Every day | ORAL | 0 refills | Status: DC

## 2021-11-29 ENCOUNTER — Other Ambulatory Visit: Payer: Self-pay | Admitting: Family Medicine

## 2021-11-29 DIAGNOSIS — E1165 Type 2 diabetes mellitus with hyperglycemia: Secondary | ICD-10-CM

## 2021-11-29 MED ORDER — OZEMPIC (0.25 OR 0.5 MG/DOSE) 2 MG/3ML ~~LOC~~ SOPN: 0.2500 mg | PEN_INJECTOR | SUBCUTANEOUS | 2 refills | Status: DC

## 2021-11-29 MED ORDER — METFORMIN HCL 500 MG PO TABS: 500.0000 mg | ORAL_TABLET | Freq: Two times a day (BID) | ORAL | 3 refills | Status: DC

## 2021-12-01 ENCOUNTER — Ambulatory Visit (INDEPENDENT_AMBULATORY_CARE_PROVIDER_SITE_OTHER): Payer: BC Managed Care – PPO

## 2021-12-01 DIAGNOSIS — R7401 Elevation of levels of liver transaminase levels: Secondary | ICD-10-CM | POA: Diagnosis not present

## 2021-12-01 DIAGNOSIS — R748 Abnormal levels of other serum enzymes: Secondary | ICD-10-CM | POA: Diagnosis not present

## 2021-12-01 DIAGNOSIS — Z9049 Acquired absence of other specified parts of digestive tract: Secondary | ICD-10-CM | POA: Diagnosis not present

## 2021-12-03 ENCOUNTER — Ambulatory Visit (INDEPENDENT_AMBULATORY_CARE_PROVIDER_SITE_OTHER): Payer: BC Managed Care – PPO | Admitting: Sports Medicine

## 2021-12-03 VITALS — Temp 97.6°F

## 2021-12-03 DIAGNOSIS — Z23 Encounter for immunization: Secondary | ICD-10-CM

## 2021-12-03 NOTE — Progress Notes (Signed)
Patient here today for her second Shingrix vaccine.  Location: Left Deltoid

## 2022-01-10 ENCOUNTER — Encounter: Payer: Self-pay | Admitting: Family Medicine

## 2022-03-02 ENCOUNTER — Telehealth: Payer: Self-pay

## 2022-03-03 MED ORDER — SEMAGLUTIDE (1 MG/DOSE) 4 MG/3ML ~~LOC~~ SOPN: 1.0000 mg | PEN_INJECTOR | SUBCUTANEOUS | 0 refills | Status: DC

## 2022-03-03 NOTE — Telephone Encounter (Signed)
Ozempic 1mg  weekly sent to the pharmacy on file.

## 2022-03-03 NOTE — Telephone Encounter (Signed)
Patient made aware RX sent.

## 2022-03-06 ENCOUNTER — Other Ambulatory Visit: Payer: Self-pay | Admitting: Medical-Surgical

## 2022-04-04 ENCOUNTER — Other Ambulatory Visit: Payer: Self-pay | Admitting: Medical-Surgical

## 2022-04-20 IMAGING — CR DG CHEST 2V
2 series · 2 of 2 positions shown · non-contrast
Comparison: 02/11/2018

CLINICAL DATA: Cough, fever, COVID positive

EXAM:
CHEST - 2 VIEW

[w chest pa]
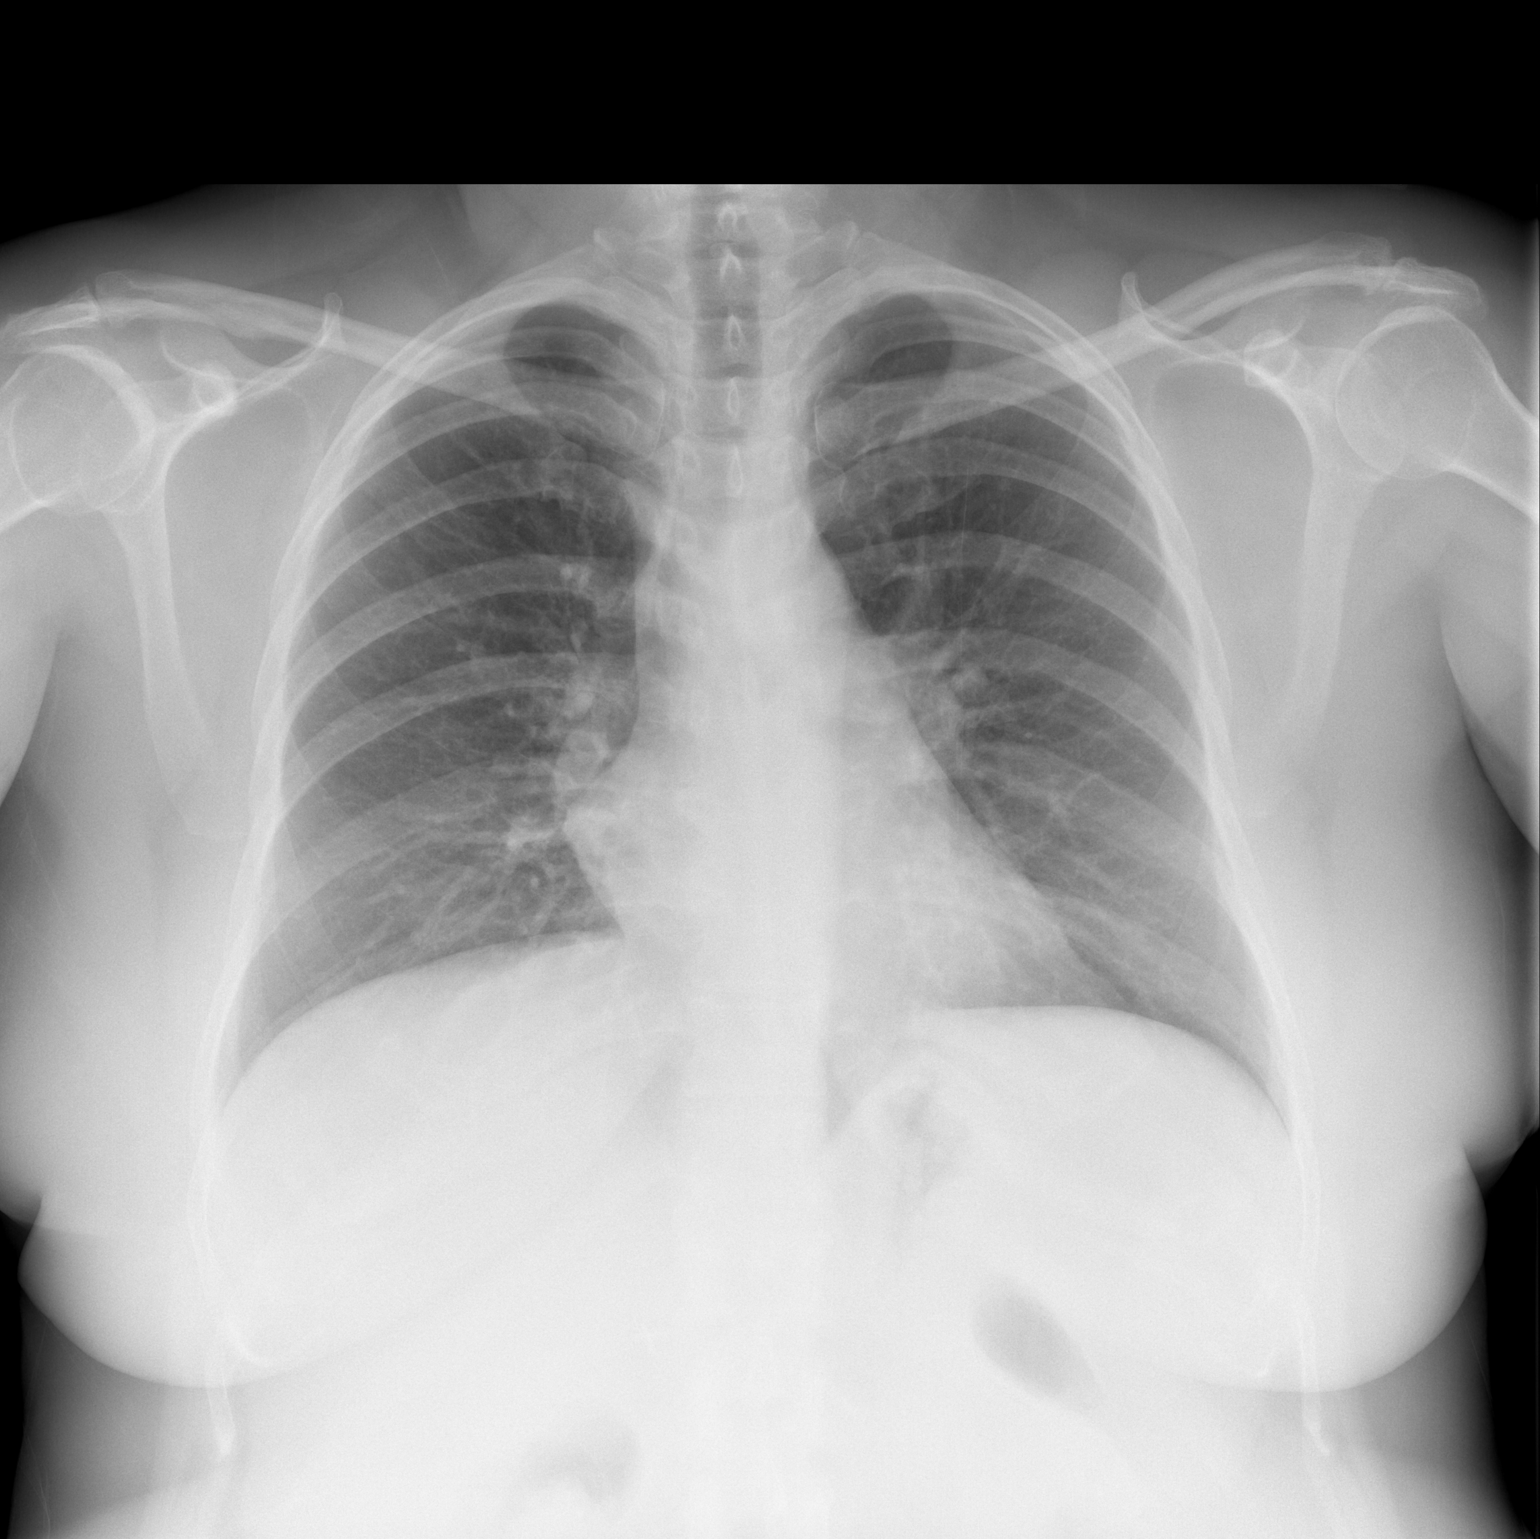

[w chest lat]
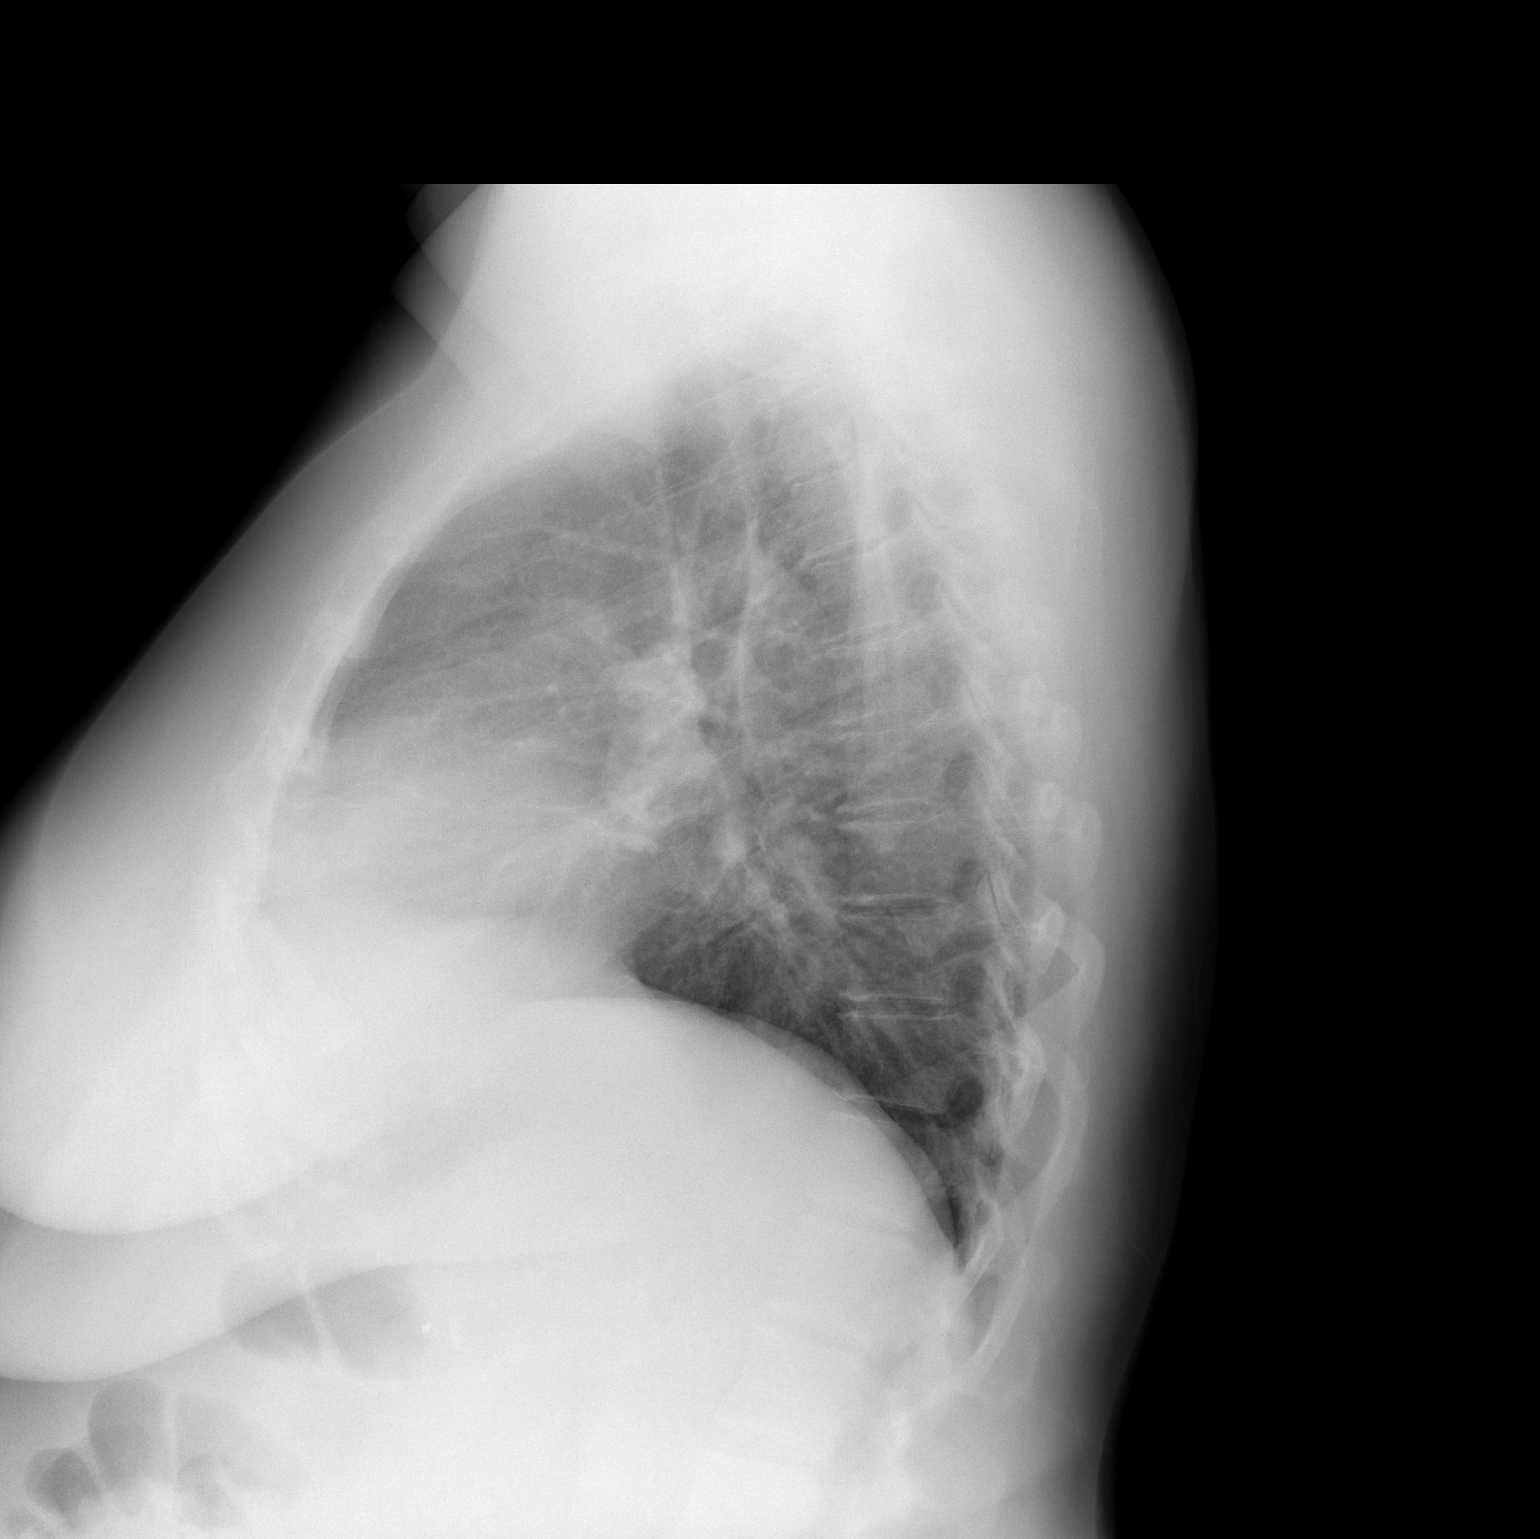

[2 of 2 positions shown; findings below may reference images not displayed]

FINDINGS: The heart size and mediastinal contours are within normal limits.
Both lungs are clear. The visualized skeletal structures are
unremarkable.
IMPRESSION: No acute abnormality of the lungs.

## 2022-05-25 NOTE — Progress Notes (Unsigned)
     Established patient visit   Patient: Monica Mcdaniel   DOB: 28-Sep-1970   52 y.o. Female  MRN: [id number] Visit Date: 05/26/2022  Today's healthcare provider: Charlton Amor, DO   No chief complaint on file.   SUBJECTIVE   No chief complaint on file.  HPI   HTN - pt on benazepril 10mg  and hctz 25mg    T2DM - last A1c was 6.8 - on ozempic   Review of Systems     No outpatient medications have been marked as taking for the 05/26/22 encounter (Appointment) with Charlton Amor, DO.    OBJECTIVE    There were no vitals taken for this visit.  Physical Exam   {Show previous labs (optional):23736}    ASSESSMENT & PLAN    Problem List Items Addressed This Visit   None   No follow-ups on file.      No orders of the defined types were placed in this encounter.   No orders of the defined types were placed in this encounter.    Charlton Amor, DO  [facility] at [facility] [phone] (phone) [phone] (fax)  [facility]

## 2022-05-26 ENCOUNTER — Encounter: Payer: Self-pay | Admitting: Family Medicine

## 2022-05-26 ENCOUNTER — Ambulatory Visit: Payer: BC Managed Care – PPO | Admitting: Family Medicine

## 2022-05-26 VITALS — BP 117/80 | HR 88 | Temp 98.2°F | Ht 62.0 in | Wt 146.1 lb

## 2022-05-26 DIAGNOSIS — E1165 Type 2 diabetes mellitus with hyperglycemia: Secondary | ICD-10-CM | POA: Diagnosis not present

## 2022-05-26 DIAGNOSIS — I10 Essential (primary) hypertension: Secondary | ICD-10-CM | POA: Diagnosis not present

## 2022-05-26 LAB — POCT GLYCOSYLATED HEMOGLOBIN (HGB A1C): Hemoglobin A1C: 4.7 % (ref 4.0–5.6)

## 2022-05-26 NOTE — Assessment & Plan Note (Addendum)
-   will decrease hctz to 12.5mg  and have pt monitor BP  - will get blood work: CMP, CBC

## 2022-05-26 NOTE — Assessment & Plan Note (Signed)
A1C: today is 6.0 on poc  - will continue therapy. Offered to increase wegovy to 1.7mg  but patient wants to stay at 1mg  and watch weight loss - she has made drastic improvements in diet and exercise

## 2022-05-26 NOTE — Patient Instructions (Signed)
Decrease hctz to 12.5mg  (cut in half)  Monitor BP Reach out in two weeks with bp numbers  Follow up in 3 months

## 2022-05-27 LAB — COMPLETE METABOLIC PANEL WITH GFR
AG Ratio: 1.9 (calc) (ref 1.0–2.5)
ALT: 24 U/L (ref 6–29)
AST: 23 U/L (ref 10–35)
Albumin: 4.7 g/dL (ref 3.6–5.1)
Alkaline phosphatase (APISO): 58 U/L (ref 37–153)
BUN: 16 mg/dL (ref 7–25)
CO2: 28 mmol/L (ref 20–32)
Calcium: 10.6 mg/dL — ABNORMAL HIGH (ref 8.6–10.4)
Chloride: 99 mmol/L (ref 98–110)
Creat: 0.73 mg/dL (ref 0.50–1.03)
Globulin: 2.5 g/dL (calc) (ref 1.9–3.7)
Glucose, Bld: 80 mg/dL (ref 65–99)
Potassium: 4 mmol/L (ref 3.5–5.3)
Sodium: 139 mmol/L (ref 135–146)
Total Bilirubin: 0.8 mg/dL (ref 0.2–1.2)
Total Protein: 7.2 g/dL (ref 6.1–8.1)
eGFR: 100 mL/min/{1.73_m2} (ref >=60)

## 2022-05-27 LAB — LIPID PANEL
Cholesterol: 170 mg/dL (ref <200)
HDL: 81 mg/dL (ref >=50)
LDL Cholesterol (Calc): 74 mg/dL (calc)
Non-HDL Cholesterol (Calc): 89 mg/dL (calc) (ref <130)
Total CHOL/HDL Ratio: 2.1 (calc) (ref <5.0)
Triglycerides: 71 mg/dL (ref <150)

## 2022-05-27 LAB — CBC
HCT: 44.8 % (ref 35.0–45.0)
Hemoglobin: 15.8 g/dL — ABNORMAL HIGH (ref 11.7–15.5)
MCH: 33.4 pg — ABNORMAL HIGH (ref 27.0–33.0)
MCHC: 35.3 g/dL (ref 32.0–36.0)
MCV: 94.7 fL (ref 80.0–100.0)
MPV: 9.9 fL (ref 7.5–12.5)
Platelets: 322 10*3/uL (ref 140–400)
RBC: 4.73 10*6/uL (ref 3.80–5.10)
RDW: 11.9 % (ref 11.0–15.0)
WBC: 8 10*3/uL (ref 3.8–10.8)

## 2022-06-02 ENCOUNTER — Other Ambulatory Visit: Payer: Self-pay | Admitting: Family Medicine

## 2022-06-23 ENCOUNTER — Encounter: Payer: Self-pay | Admitting: Family Medicine

## 2022-08-25 ENCOUNTER — Encounter: Payer: Self-pay | Admitting: Family Medicine

## 2022-08-25 ENCOUNTER — Ambulatory Visit: Payer: BC Managed Care – PPO | Admitting: Family Medicine

## 2022-08-25 VITALS — BP 121/69 | HR 66 | Resp 18 | Ht 62.0 in | Wt 139.0 lb

## 2022-08-25 DIAGNOSIS — E1165 Type 2 diabetes mellitus with hyperglycemia: Secondary | ICD-10-CM | POA: Diagnosis not present

## 2022-08-25 DIAGNOSIS — I10 Essential (primary) hypertension: Secondary | ICD-10-CM

## 2022-08-25 MED ORDER — OZEMPIC (0.25 OR 0.5 MG/DOSE) 2 MG/3ML ~~LOC~~ SOPN: 0.5000 mg | PEN_INJECTOR | SUBCUTANEOUS | 5 refills | Status: DC

## 2022-08-25 NOTE — Progress Notes (Signed)
Established patient visit   Patient: Monica Mcdaniel   DOB: 1970-10-19   52 y.o. Female  MRN: [id number] Visit Date: 08/25/2022  Today's healthcare provider: Charlton Amor, DO   Chief Complaint  Patient presents with   Follow-up    HTN check    SUBJECTIVE    Chief Complaint  Patient presents with   Follow-up    HTN check   HPI HPI     Follow-up    Additional comments: HTN check      Last edited by Roselyn Reef, CMA on 08/25/2022  4:04 PM.      Patient presents today for follow-up on hypertension.  At last visit HCTZ was decreased to 12.5 mg and patient was asked to monitor her blood pressure at home. BP has been well that she has gotten off all of her blood pressure medication.  T2DM Patient is currently on Ozempic 1 mg. Weight today is 139.  Review of Systems  Constitutional:  Negative for activity change, fatigue and fever.  Respiratory:  Negative for cough and shortness of breath.   Cardiovascular:  Negative for chest pain.  Gastrointestinal:  Negative for abdominal pain.  Genitourinary:  Negative for difficulty urinating.       Current Meds  Medication Sig   Semaglutide,0.25 or 0.5MG /DOS, (OZEMPIC, 0.25 OR 0.5 MG/DOSE,) 2 MG/3ML SOPN Inject 0.5 mg into the skin once a week.   [DISCONTINUED] benazepril (LOTENSIN) 10 MG tablet Take 1 tablet (10 mg total) by mouth daily.   [DISCONTINUED] Cholecalciferol (VITAMIN D3) 25 MCG (1000 UT) CAPS Take 1 capsule (1,000 Units total) by mouth daily.   [DISCONTINUED] hydrochlorothiazide (HYDRODIURIL) 25 MG tablet Take 1 tablet (25 mg total) by mouth daily.   [DISCONTINUED] metFORMIN (GLUCOPHAGE) 500 MG tablet Take 1 tablet (500 mg total) by mouth 2 (two) times daily with a meal.   [DISCONTINUED] Multiple Vitamin (MULTIVITAMIN) capsule Take 1 capsule by mouth daily.   [DISCONTINUED] omeprazole (PRILOSEC) 20 MG capsule TAKE 1 CAPSULE BY MOUTH EVERY DAY.   [DISCONTINUED] Semaglutide, 1 MG/DOSE, (OZEMPIC, 1 MG/DOSE,) 4 MG/3ML  SOPN INJECT 1 MG ONCE A WEEK AS DIRECTED    OBJECTIVE    BP 121/69 (BP Location: Left Arm, Patient Position: Sitting, Cuff Size: Normal)   Pulse 66   Resp 18   Ht 5\' 2"  (1.575 m)   Wt 139 lb (63 kg)   SpO2 100%   BMI 25.42 kg/m   Physical Exam Vitals and nursing note reviewed.  Constitutional:      General: She is not in acute distress.    Appearance: Normal appearance.  HENT:     Head: Normocephalic and atraumatic.     Right Ear: External ear normal.     Left Ear: External ear normal.     Nose: Nose normal.  Eyes:     Conjunctiva/sclera: Conjunctivae normal.  Cardiovascular:     Rate and Rhythm: Normal rate and regular rhythm.  Pulmonary:     Effort: Pulmonary effort is normal.     Breath sounds: Normal breath sounds.  Neurological:     General: No focal deficit present.     Mental Status: She is alert and oriented to person, place, and time.  Psychiatric:        Mood and Affect: Mood normal.        Behavior: Behavior normal.        Thought Content: Thought content normal.        Judgment: Judgment normal.  ASSESSMENT & PLAN    Problem List Items Addressed This Visit       Cardiovascular and Mediastinum   Hypertension - Primary    - pt blood pressure is well controlled and she is currently off all medication. She is continuing to do diet and exercise modification        Endocrine   Type 2 diabetes mellitus with hyperglycemia, without long-term current use of insulin (HCC)    - pt doing well on ozempic, will decrease to 0.5mg  to have pt on stable dose and stabilize weight loss - she is doing great with ozempic and it has really decreased her risk factors      Relevant Medications   Semaglutide,0.25 or 0.5MG /DOS, (OZEMPIC, 0.25 OR 0.5 MG/DOSE,) 2 MG/3ML SOPN    Return in about 6 months (around 02/25/2023).      Meds ordered this encounter  Medications   Semaglutide,0.25 or 0.5MG /DOS, (OZEMPIC, 0.25 OR 0.5 MG/DOSE,) 2 MG/3ML SOPN    Sig:  Inject 0.5 mg into the skin once a week.    Dispense:  3 mL    Refill:  5    No orders of the defined types were placed in this encounter.    Charlton Amor, DO  [facility] at [facility] [phone] (phone) [phone] (fax)  [facility]

## 2022-08-25 NOTE — Assessment & Plan Note (Signed)
-   pt blood pressure is well controlled and she is currently off all medication. She is continuing to do diet and exercise modification

## 2022-08-25 NOTE — Assessment & Plan Note (Signed)
-   pt doing well on ozempic, will decrease to 0.5mg  to have pt on stable dose and stabilize weight loss - she is doing great with ozempic and it has really decreased her risk factors

## 2022-10-14 ENCOUNTER — Other Ambulatory Visit: Payer: Self-pay | Admitting: Medical Genetics

## 2022-10-14 DIAGNOSIS — Z006 Encounter for examination for normal comparison and control in clinical research program: Secondary | ICD-10-CM

## 2022-11-25 ENCOUNTER — Other Ambulatory Visit (HOSPITAL_COMMUNITY)
Admission: RE | Admit: 2022-11-25 | Discharge: 2022-11-25 | Disposition: A | Discharge status: Home / Self Care | Payer: BC Managed Care – PPO | Source: Ambulatory Visit | Attending: Oncology | Admitting: Oncology

## 2022-11-25 DIAGNOSIS — Z006 Encounter for examination for normal comparison and control in clinical research program: Secondary | ICD-10-CM | POA: Insufficient documentation

## 2022-12-08 LAB — HELIX MOLECULAR SCREEN: Genetic Analysis Overall Interpretation: NEGATIVE

## 2023-02-28 ENCOUNTER — Ambulatory Visit: Payer: BC Managed Care – PPO | Admitting: Family Medicine

## 2023-02-28 NOTE — Progress Notes (Deleted)
     Established patient visit   Patient: Monica Mcdaniel   DOB: [birth year]   53 y.o. Female  MRN: [id number] Visit Date: 02/28/2023  Today's healthcare provider: Bernice GORMAN Juneau, DO   No chief complaint on file.   SUBJECTIVE   No chief complaint on file.  HPI  Pt presents for T2DM follow up currently managed on ozempic . We have down titrated her ozempic  to 0.5mg  to see how she does.    Review of Systems     No outpatient medications have been marked as taking for the 02/28/23 encounter (Appointment) with Juneau Bernice GORMAN, DO.    OBJECTIVE    There were no vitals taken for this visit.  Physical Exam     ASSESSMENT & PLAN    Problem List Items Addressed This Visit   None   No follow-ups on file.      No orders of the defined types were placed in this encounter.   No orders of the defined types were placed in this encounter.    Bernice GORMAN Juneau, DO  [facility] at [facility] [phone] (phone) [phone] (fax)  [facility]

## 2023-03-02 ENCOUNTER — Ambulatory Visit: Payer: BC Managed Care – PPO | Admitting: Family Medicine

## 2023-03-02 VITALS — BP 132/58 | HR 60 | Ht 62.0 in | Wt 139.2 lb

## 2023-03-02 DIAGNOSIS — E1165 Type 2 diabetes mellitus with hyperglycemia: Secondary | ICD-10-CM | POA: Diagnosis not present

## 2023-03-02 DIAGNOSIS — M7711 Lateral epicondylitis, right elbow: Secondary | ICD-10-CM | POA: Diagnosis not present

## 2023-03-02 DIAGNOSIS — Z1231 Encounter for screening mammogram for malignant neoplasm of breast: Secondary | ICD-10-CM

## 2023-03-02 DIAGNOSIS — Z23 Encounter for immunization: Secondary | ICD-10-CM

## 2023-03-02 DIAGNOSIS — I10 Essential (primary) hypertension: Secondary | ICD-10-CM

## 2023-03-02 DIAGNOSIS — Z7985 Long-term (current) use of injectable non-insulin antidiabetic drugs: Secondary | ICD-10-CM

## 2023-03-02 LAB — POCT GLYCOSYLATED HEMOGLOBIN (HGB A1C): HbA1c POC (<> result, manual entry): 5 % (ref 4.0–5.6)

## 2023-03-02 MED ORDER — METHYLPREDNISOLONE 4 MG PO TBPK: ORAL_TABLET | ORAL | 0 refills | Status: DC

## 2023-03-02 MED ORDER — OZEMPIC (1 MG/DOSE) 4 MG/3ML ~~LOC~~ SOPN: 1.0000 mg | PEN_INJECTOR | SUBCUTANEOUS | 6 refills | Status: DC

## 2023-03-02 NOTE — Assessment & Plan Note (Signed)
-   pt notes some pain near right extensor tendons - have given medrol dose pack to help with inflammation at this time - given home PT exercises to try for pain relief

## 2023-03-02 NOTE — Progress Notes (Signed)
Established patient visit   Patient: Monica Mcdaniel   DOB: 29-May-1970   53 y.o. Female  MRN: [id number] Visit Date: 03/02/2023  Today's healthcare provider: Charlton Amor, DO   Chief Complaint  Patient presents with   Diabetes    Never had DM eye exam - requesting where to go to have this done    Elbow Pain    Left elbow pain x 4 months    Hot Flashes    Worsening hot flashes x 6 mths - requesting a referral to OB-Gyn for eval of this and pap smear. Needing MMG ordered.     SUBJECTIVE    Chief Complaint  Patient presents with   Diabetes    Never had DM eye exam - requesting where to go to have this done    Elbow Pain    Left elbow pain x 4 months    Hot Flashes    Worsening hot flashes x 6 mths - requesting a referral to OB-Gyn for eval of this and pap smear. Needing MMG ordered.    HPI HPI     Diabetes    Additional comments: Never had DM eye exam - requesting where to go to have this done         Elbow Pain    Additional comments: Left elbow pain x 4 months         Hot Flashes    Additional comments: Worsening hot flashes x 6 mths - requesting a referral to OB-Gyn for eval of this and pap smear. Needing MMG ordered.       Last edited by Elizabeth Palau, LPN on 05/23/8117 11:04 AM.      Pt presents for follow up on HTN and DM  HTN - lifestyle modifications  - BP well controlled   T2DM - we have been decreasing ozempic to 0.5mg    Review of Systems  Constitutional:  Negative for activity change, fatigue and fever.  Respiratory:  Negative for cough and shortness of breath.   Cardiovascular:  Negative for chest pain.  Gastrointestinal:  Negative for abdominal pain.  Genitourinary:  Negative for difficulty urinating.  Musculoskeletal:        Right elbow pain       Current Meds  Medication Sig   methylPREDNISolone (MEDROL DOSEPAK) 4 MG TBPK tablet Follow instructions on pill pack   Semaglutide, 1 MG/DOSE, (OZEMPIC, 1 MG/DOSE,) 4 MG/3ML SOPN  Inject 1 mg into the skin once a week.   [DISCONTINUED] Semaglutide,0.25 or 0.5MG /DOS, (OZEMPIC, 0.25 OR 0.5 MG/DOSE,) 2 MG/3ML SOPN Inject 0.5 mg into the skin once a week.    OBJECTIVE    BP (!) 132/58 (BP Location: Left Arm, Patient Position: Sitting, Cuff Size: Normal)   Pulse 60   Ht 5\' 2"  (1.575 m)   Wt 139 lb 4 oz (63.2 kg)   SpO2 100%   BMI 25.47 kg/m   Physical Exam Vitals and nursing note reviewed.  Constitutional:      General: She is not in acute distress.    Appearance: Normal appearance.  HENT:     Head: Normocephalic and atraumatic.     Right Ear: External ear normal.     Left Ear: External ear normal.     Nose: Nose normal.  Eyes:     Conjunctiva/sclera: Conjunctivae normal.  Cardiovascular:     Rate and Rhythm: Normal rate and regular rhythm.  Pulmonary:     Effort: Pulmonary effort is normal.  Breath sounds: Normal breath sounds.  Neurological:     General: No focal deficit present.     Mental Status: She is alert and oriented to person, place, and time.  Psychiatric:        Mood and Affect: Mood normal.        Behavior: Behavior normal.        Thought Content: Thought content normal.        Judgment: Judgment normal.        ASSESSMENT & PLAN    Problem List Items Addressed This Visit       Cardiovascular and Mediastinum   Hypertension   resolved        Endocrine   Type 2 diabetes mellitus with hyperglycemia, without long-term current use of insulin (HCC) - Primary   Increase ozempic to 1mg  as pt says she is noticing the way she is thinking about food is back and she is having to over exercise to maintain her weight - will do a 83mo follow up - A1c 4.7, doing very well      Relevant Medications   Semaglutide, 1 MG/DOSE, (OZEMPIC, 1 MG/DOSE,) 4 MG/3ML SOPN   Other Relevant Orders   POCT HgB A1C (Completed)   Microalbumin / creatinine urine ratio   Basic Metabolic Panel (BMET)   Ambulatory referral to Ophthalmology      Musculoskeletal and Integument   Right lateral epicondylitis   - pt notes some pain near right extensor tendons - have given medrol dose pack to help with inflammation at this time - given home PT exercises to try for pain relief      Relevant Medications   methylPREDNISolone (MEDROL DOSEPAK) 4 MG TBPK tablet   Other Visit Diagnoses       Encounter for screening mammogram for malignant neoplasm of breast       Relevant Orders   MM DIGITAL SCREENING BILATERAL       Return in about 6 months (around 08/30/2023) for t2dm follow up.      Meds ordered this encounter  Medications   Semaglutide, 1 MG/DOSE, (OZEMPIC, 1 MG/DOSE,) 4 MG/3ML SOPN    Sig: Inject 1 mg into the skin once a week.    Dispense:  3 mL    Refill:  6   methylPREDNISolone (MEDROL DOSEPAK) 4 MG TBPK tablet    Sig: Follow instructions on pill pack    Dispense:  21 tablet    Refill:  0    Orders Placed This Encounter  Procedures   MM DIGITAL SCREENING BILATERAL    Standing Status:   Future    Expiration Date:   03/01/2024    Is the patient pregnant?:   No    Preferred imaging location?:   [facility]    Reason for exam::   screening    Release to patient:   Immediate   Microalbumin / creatinine urine ratio   Basic Metabolic Panel (BMET)   Ambulatory referral to Ophthalmology    Referral Priority:   Routine    Referral Type:   Consultation    Referral Reason:   Specialty Services Required    Requested Specialty:   Ophthalmology    Number of Visits Requested:   1   POCT HgB A1C     Charlton Amor, DO  [facility] at [facility] [phone] (phone) [phone] (fax)  [facility]

## 2023-03-02 NOTE — Assessment & Plan Note (Signed)
Increase ozempic to 1mg  as pt says she is noticing the way she is thinking about food is back and she is having to over exercise to maintain her weight - will do a 90mo follow up - A1c 4.7, doing very well

## 2023-03-02 NOTE — Assessment & Plan Note (Signed)
resolved 

## 2023-03-04 LAB — MICROALBUMIN / CREATININE URINE RATIO
Creatinine, Urine: 164.5 mg/dL
Microalb/Creat Ratio: 6 mg/g{creat} (ref 0–29)
Microalbumin, Urine: 9.2 ug/mL

## 2023-03-04 LAB — BASIC METABOLIC PANEL
BUN/Creatinine Ratio: 25 — ABNORMAL HIGH (ref 9–23)
BUN: 18 mg/dL (ref 6–24)
CO2: 23 mmol/L (ref 20–29)
Calcium: 9.7 mg/dL (ref 8.7–10.2)
Chloride: 104 mmol/L (ref 96–106)
Creatinine, Ser: 0.71 mg/dL (ref 0.57–1.00)
Glucose: 78 mg/dL (ref 70–99)
Potassium: 4.3 mmol/L (ref 3.5–5.2)
Sodium: 142 mmol/L (ref 134–144)
eGFR: 102 mL/min/{1.73_m2} (ref >59)

## 2023-03-06 ENCOUNTER — Encounter: Payer: Self-pay | Admitting: Family Medicine

## 2023-03-09 ENCOUNTER — Ambulatory Visit
Admission: RE | Admit: 2023-03-09 | Discharge: 2023-03-09 | Disposition: A | Discharge status: Home / Self Care | Payer: BC Managed Care – PPO | Source: Ambulatory Visit | Attending: Emergency Medicine | Admitting: Emergency Medicine

## 2023-03-09 VITALS — BP 131/81 | HR 69 | Temp 97.9°F | Resp 17

## 2023-03-09 DIAGNOSIS — S6991XA Unspecified injury of right wrist, hand and finger(s), initial encounter: Secondary | ICD-10-CM | POA: Diagnosis not present

## 2023-03-09 MED ORDER — MUPIROCIN 2 % EX OINT: 1.0000 | TOPICAL_OINTMENT | Freq: Two times a day (BID) | CUTANEOUS | 0 refills | Status: DC

## 2023-03-09 MED ORDER — DOXYCYCLINE HYCLATE 100 MG PO CAPS: 100.0000 mg | ORAL_CAPSULE | Freq: Two times a day (BID) | ORAL | 0 refills | Status: AC

## 2023-03-09 NOTE — ED Triage Notes (Signed)
Pt c/o cut to finger last fri from [employer]. Pain worsening in last couple days. Red and swollen. Worried about possible infection. Aleve prn.

## 2023-03-09 NOTE — ED Provider Notes (Signed)
[facility]    CSN: [id number] Arrival date & time: 03/09/23  1010      History   Chief Complaint Chief Complaint  Patient presents with   Finger Injury    APPT 1030A    HPI Monica Mcdaniel is a 53 y.o. female.  Injured right index finger about 1 week ago when opening a box Pain has been worsening over the last few days, with swelling and redness. Has used neosporin and aleve Patient concerned about infection She goes to the gym and worried about bacteria entering open cut No fevers  Past Medical History:  Diagnosis Date   Anxiety    GERD (gastroesophageal reflux disease)    Heart murmur    functional heart murmur--as a child   Hypertension    Iron deficiency anemia 07/22/2019    Patient Active Problem List   Diagnosis Date Noted   Right lateral epicondylitis 03/02/2023   Type 2 diabetes mellitus with hyperglycemia, without long-term current use of insulin (HCC) 05/26/2022   Morbid obesity (HCC) 11/24/2021   Iron deficiency anemia 07/22/2019   Gastroesophageal reflux disease 05/01/2018   Persistent cough 05/01/2018   Hyperglycemia 11/08/2017   Hypertension 10/10/2017    Past Surgical History:  Procedure Laterality Date   CHOLECYSTECTOMY     KNEE SURGERY Left    removal of benign tumor   REFRACTIVE SURGERY     TUBAL LIGATION      OB History     Gravida  4   Para  3   Term      Preterm      AB  1   Living  3      SAB      IAB      Ectopic      Multiple      Live Births               Home Medications    Prior to Admission medications   Medication Sig Start Date End Date Taking? Authorizing Provider  doxycycline (VIBRAMYCIN) 100 MG capsule Take 1 capsule (100 mg total) by mouth 2 (two) times daily for 5 days. 03/09/23 03/14/23 Yes Joslynn Jamroz, Lurena Joiner, PA-C  mupirocin ointment (BACTROBAN) 2 % Apply 1 Application topically 2 (two) times daily. 03/09/23  Yes Kian Ottaviano, PA-C  Semaglutide, 1 MG/DOSE, (OZEMPIC, 1 MG/DOSE,) 4  MG/3ML SOPN Inject 1 mg into the skin once a week. 03/02/23   Charlton Amor, DO    Family History Family History  Problem Relation Age of Onset   High blood pressure Mother    Diabetes Mother    Lung cancer Mother    Hypertension Mother    Cancer Father    Healthy Brother    Healthy Brother    Diabetes Maternal Grandmother    Stroke Maternal Grandmother    Thyroid disease Maternal Grandmother     Social History Social History   Tobacco Use   Smoking status: Former    Current packs/day: 0.00    Average packs/day: 1 pack/day for 7.0 years (7.0 ttl pk-yrs)    Types: Cigarettes    Start date: 5    Quit date: 1997    Years since quitting: 28.0   Smokeless tobacco: Former  Building services engineer status: Never Used  Substance Use Topics   Alcohol use: Yes    Alcohol/week: 3.0 - 4.0 standard drinks of alcohol    Types: 3 - 4 Glasses of wine per week  Comment: 3-4/wk   Drug use: Never     Allergies   Patient has no known allergies.   Review of Systems Review of Systems Per HPI  Physical Exam Triage Vital Signs ED Triage Vitals  Encounter Vitals Group     BP 03/09/23 1018 131/81     Systolic BP Percentile --      Diastolic BP Percentile --      Pulse Rate 03/09/23 1018 69     Resp 03/09/23 1018 17     Temp 03/09/23 1018 97.9 F (36.6 C)     Temp Source 03/09/23 1018 Oral     SpO2 03/09/23 1018 99 %     Weight --      Height --      Head Circumference --      Peak Flow --      Pain Score 03/09/23 1019 3     Pain Loc --      Pain Education --      Exclude from Growth Chart --    No data found.  Updated Vital Signs BP 131/81 (BP Location: Left Arm)   Pulse 69   Temp 97.9 F (36.6 C) (Oral)   Resp 17   LMP  (LMP Unknown)   SpO2 99%    Physical Exam Vitals and nursing note reviewed.  Constitutional:      General: She is not in acute distress. Cardiovascular:     Rate and Rhythm: Normal rate and regular rhythm.     Pulses: Normal pulses.   Pulmonary:     Effort: Pulmonary effort is normal.  Musculoskeletal:        General: Swelling present. Normal range of motion.     Cervical back: Normal range of motion.  Skin:    General: Skin is warm and dry.     Capillary Refill: Capillary refill takes less than 2 seconds.     Findings: Erythema present.     Comments: Small healing laceration right index finger, lateral PIP. There is surrounding erythema. No drainage. Minimal swelling  Neurological:     Mental Status: She is alert and oriented to person, place, and time.     UC Treatments / Results  Labs (all labs ordered are listed, but only abnormal results are displayed) Labs Reviewed - No data to display  EKG   Radiology No results found.  Procedures Procedures (including critical care time)  Medications Ordered in UC Medications - No data to display  Initial Impression / Assessment and Plan / UC Course  I have reviewed the triage vital signs and the nursing notes.  Pertinent labs & imaging results that were available during my care of the patient were reviewed by me and considered in my medical decision making (see chart for details).  Mild erythema. Recommend bactroban topically BID Monitor next 3-4 days, if symptoms persist or worsen have sent doxycycline. Return precautions discussed. Patient agrees to plan  Final Clinical Impressions(s) / UC Diagnoses   Final diagnoses:  Injury of finger of right hand, initial encounter     Discharge Instructions      Bactroban twice daily for 1-2 weeks  If in 3-4 days symptoms have not improved or seem worse, you can start the doxycycline. Take twice daily for 5 days. Take with food to avoid upset stomach     ED Prescriptions     Medication Sig Dispense Auth. Provider   mupirocin ointment (BACTROBAN) 2 % Apply 1 Application topically 2 (two) times  daily. 15 g Nilton Lave, PA-C   doxycycline (VIBRAMYCIN) 100 MG capsule Take 1 capsule (100 mg total) by  mouth 2 (two) times daily for 5 days. 10 capsule Valda Christenson, Lurena Joiner, PA-C      PDMP not reviewed this encounter.   Marlow Baars, [place] 03/09/23 1105

## 2023-03-09 NOTE — Discharge Instructions (Addendum)
Bactroban twice daily for 1-2 weeks  If in 3-4 days symptoms have not improved or seem worse, you can start the doxycycline. Take twice daily for 5 days. Take with food to avoid upset stomach

## 2023-03-14 ENCOUNTER — Other Ambulatory Visit: Payer: Self-pay | Admitting: Family Medicine

## 2023-03-22 ENCOUNTER — Encounter: Payer: Self-pay | Admitting: Obstetrics and Gynecology

## 2023-03-22 ENCOUNTER — Ambulatory Visit (INDEPENDENT_AMBULATORY_CARE_PROVIDER_SITE_OTHER): Payer: BC Managed Care – PPO | Admitting: Obstetrics and Gynecology

## 2023-03-22 VITALS — BP 157/79 | HR 60 | Resp 16 | Ht 62.0 in | Wt 138.0 lb

## 2023-03-22 DIAGNOSIS — R61 Generalized hyperhidrosis: Secondary | ICD-10-CM

## 2023-03-22 DIAGNOSIS — N951 Menopausal and female climacteric states: Secondary | ICD-10-CM | POA: Diagnosis not present

## 2023-03-22 DIAGNOSIS — Z01419 Encounter for gynecological examination (general) (routine) without abnormal findings: Secondary | ICD-10-CM | POA: Diagnosis not present

## 2023-03-22 MED ORDER — PROGESTERONE MICRONIZED 100 MG PO CAPS: 100.0000 mg | ORAL_CAPSULE | Freq: Every day | ORAL | 12 refills | Status: AC

## 2023-03-22 MED ORDER — ESTRADIOL 0.025 MG/24HR TD PTWK: 0.0250 mg | MEDICATED_PATCH | TRANSDERMAL | 12 refills | Status: AC

## 2023-03-22 NOTE — Patient Instructions (Addendum)
 The Menopause Manifesto - Dr. Kelley Paterson  Take your progesterone  pills at night to help with sleep

## 2023-03-22 NOTE — Progress Notes (Addendum)
 ANNUAL EXAM Patient name: Monica Mcdaniel MRN [id number]  Date of birth: [birth year] Chief Complaint:   Annual Exam  History of Present Illness:   Monica Mcdaniel is a 53 y.o. menopausal [id number] being seen today for a routine annual exam.  Current complaints:  LMP ~08/2021. Has had hot flashes a couple times a day for awhile, but in the past few months started having night sweats that are making it difficult for her to sleep. Waking up several times in the night hot/sweaty. Significantly affecting quality of life.  70lb WL in the past ~1 year from lifestyle changes and ozempic  after being diagnosed with diabetes.   Last pap 09/30/19. Results were: NILM w/ HRHPV negative Last mammogram: 07/17/19. Results were: normal. Family h/o breast cancer: yes aunt - postmenopausal Last colonoscopy: Cologuard negative 10/26/20. Family h/o colorectal cancer: no     03/02/2023   11:02 AM 08/25/2022    4:08 PM 05/26/2022    8:14 AM 07/17/2019    8:41 AM 05/01/2018    8:36 AM  Depression screen PHQ 2/9  Decreased Interest 0 0 0 1 0  Down, Depressed, Hopeless 0 0 0 1 0  PHQ - 2 Score 0 0 0 2 0  Altered sleeping    3 3  Tired, decreased energy    3 3  Change in appetite    3 0  Feeling bad or failure about yourself     0 0  Trouble concentrating    0 0  Moving slowly or fidgety/restless    0 0  Suicidal thoughts    0 0  PHQ-9 Score    11 6  Difficult doing work/chores    Somewhat difficult Somewhat difficult        08/25/2022    4:08 PM 07/17/2019    8:42 AM 05/01/2018    8:36 AM 03/12/2018   11:41 AM  GAD 7 : Generalized Anxiety Score  Nervous, Anxious, on Edge 0 3 3 1   Control/stop worrying 0 3 2 0  Worry too much - different things 0 3 1 0  Trouble relaxing 0 3 1 0  Restless 0 3 1 0  Easily annoyed or irritable 0 3 2 1   Afraid - awful might happen 0 0 0 0  Total GAD 7 Score 0 18 10 2   Anxiety Difficulty  Somewhat difficult Somewhat difficult Not difficult at all     Review of Systems:   Pertinent items  are noted in HPI Denies any headaches, blurred vision, fatigue, shortness of breath, chest pain, abdominal pain, abnormal vaginal discharge/itching/odor/irritation, problems with periods, bowel movements, urination, or intercourse unless otherwise stated above. Pertinent History Reviewed:  Reviewed past medical,surgical, social and family history.  Reviewed problem list, medications and allergies. Physical Assessment:   Vitals:   03/22/23 1454  BP: (!) 157/79  Pulse: 60  Resp: 16  Weight: 138 lb (62.6 kg)  Height: 5' 2 (1.575 m)  Body mass index is 25.24 kg/m.        Physical Examination:   General appearance - well appearing, and in no distress  Mental status - alert, oriented to person, place, and time  Chest - respiratory effort normal  Heart - normal peripheral perfusion  Breasts - breasts appear normal, no suspicious masses, no skin or nipple changes or axillary nodes  Abdomen - soft, nontender, nondistended, no masses or organomegaly  Pelvic - VULVA: normal appearing vulva with no masses, tenderness or lesions  VAGINA: normal appearing vagina with  normal color and discharge, no lesions  CERVIX: normal texture, no CMT  UTERUS: uterus is felt to be normal size, shape, consistency and nontender   ADNEXA: No adnexal masses or tenderness noted.  Chaperone present for exam  No results found for this or any previous visit (from the past 24 hours).  Assessment & Plan:  1) Well-Woman Exam Mammogram: Scheduled 04/17/23 Colonoscopy: Cologuard due 10/2023 Pap: Up to date, next due 2026 GC/CT: Declines HIV/HCV:Recommended, pt accepts  2) Hot flashes due to menopause, night sweats - Reviewed that hormone therapy is the first line treatment for hot flashes/vasomotor symptoms of menopause - Discussed goals of therapy i.e. reduction of hot flashes (not complete resolution). Reviewed full response takes up to 2-3 months - We use the lowest effective dose to manage symptoms for the  shortest amount of time needed - Hormone therapy appears to be most beneficial and least risky for women age 12-59 who are less than 10 years from menopause. The only significant increased risk appears to be VTE in women age 56-59. Other potential risks include breast cancer, heart attack, stroke, gallbladder disease, incontinence and these risks increase with age and duration of hormone use - Because the patient has a uterus, we discussed the need for estrogen WITH progesterone  to mitigate the risk of endometrial hyperplasia/cancer - Recommended estrogen patch with micronized progesterone  due to theoretical lower risk of VTE and for potential benefits of MP on sleep. Reviewed there are other routes available.  - After discussion, Monica Mcdaniel would like to try hormone therapy. RX sent below  3) Elevated blood pressure reading Pt will monitor at home and follow up with PCP if elevated  Labs/procedures today:   Orders Placed This Encounter  Procedures   HCV Ab w Reflex to Quant PCR   HIV antibody (with reflex)   Meds:  Meds ordered this encounter  Medications   estradiol  (CLIMARA ) 0.025 mg/24hr patch    Sig: Place 1 patch (0.025 mg total) onto the skin once a week.    Dispense:  4 patch    Refill:  12   progesterone  (PROMETRIUM ) 100 MG capsule    Sig: Take 1 capsule (100 mg total) by mouth daily.    Dispense:  30 capsule    Refill:  12   Follow-up: Return in about 4 weeks (around 04/19/2023) for follow up night sweats.  Kieth JAYSON Carolin, MD 03/22/2023 3:30 PM

## 2023-03-24 ENCOUNTER — Encounter: Payer: Self-pay | Admitting: Obstetrics and Gynecology

## 2023-03-24 LAB — HIV ANTIBODY (ROUTINE TESTING W REFLEX): HIV Screen 4th Generation wRfx: NONREACTIVE

## 2023-03-24 LAB — HCV AB W REFLEX TO QUANT PCR: HCV Ab: NONREACTIVE

## 2023-03-24 LAB — HCV INTERPRETATION

## 2023-04-19 ENCOUNTER — Ambulatory Visit: Payer: BC Managed Care – PPO

## 2023-04-19 DIAGNOSIS — Z1231 Encounter for screening mammogram for malignant neoplasm of breast: Secondary | ICD-10-CM | POA: Diagnosis not present

## 2023-04-27 ENCOUNTER — Encounter: Payer: Self-pay | Admitting: Medical-Surgical

## 2023-06-22 ENCOUNTER — Encounter: Payer: Self-pay | Admitting: Family Medicine

## 2023-08-30 ENCOUNTER — Ambulatory Visit: Payer: BC Managed Care – PPO | Admitting: Urgent Care

## 2023-08-30 ENCOUNTER — Encounter: Payer: Self-pay | Admitting: Urgent Care

## 2023-08-30 VITALS — BP 121/80 | HR 67 | Resp 18 | Ht 62.0 in | Wt 141.0 lb

## 2023-08-30 DIAGNOSIS — E1165 Type 2 diabetes mellitus with hyperglycemia: Secondary | ICD-10-CM

## 2023-08-30 DIAGNOSIS — E559 Vitamin D deficiency, unspecified: Secondary | ICD-10-CM

## 2023-08-30 DIAGNOSIS — M25552 Pain in left hip: Secondary | ICD-10-CM

## 2023-08-30 DIAGNOSIS — Z7985 Long-term (current) use of injectable non-insulin antidiabetic drugs: Secondary | ICD-10-CM | POA: Diagnosis not present

## 2023-08-30 MED ORDER — PREDNISONE 10 MG (21) PO TBPK: ORAL_TABLET | Freq: Every day | ORAL | 0 refills | Status: DC

## 2023-08-30 MED ORDER — OZEMPIC (1 MG/DOSE) 4 MG/3ML ~~LOC~~ SOPN: 1.0000 mg | PEN_INJECTOR | SUBCUTANEOUS | 6 refills | Status: DC

## 2023-08-30 NOTE — Patient Instructions (Signed)
 Continue ozemic at 1mg  weekly. Labs drawn today.  Please take the prednisone  pack per label instructions. Please look up hip labrum stretches - your hip sounds like it could be a labral tear  If your symptoms persist despite home PT and prednisone , please notify me and we will obtain xray/ mri as needed.  Return in 6 months.

## 2023-08-30 NOTE — Progress Notes (Unsigned)
 Established Patient Office Visit  Subjective:  Patient ID: Monica Mcdaniel, female    DOB: [birth year]  Age: 53 y.o. MRN: [id number]  Chief Complaint  Patient presents with   Medical Management of Chronic Issues    T2DM last A1C 5.0 also mentioned left side groin pain x 51month    HPI  Discussed the use of AI scribe software for clinical note transcription with the patient, who gave verbal consent to proceed.  History of Present Illness   Monica Mcdaniel is a 53 year old female who presents for follow-up on her diabetes management and hip pain.She has a history of diabetes, currently well-controlled with a recent A1c of 5.0. She has lost approximately 70 pounds through the use of Ozempic , dietary changes, and increased physical activity. She is on a 1 mg dose of Ozempic , which helps control her eating habits, particularly stress and boredom eating. Attempts to lower the dose resulted in increased preoccupation with meal times, prompting a return to the higher dose. No side effects from Ozempic  are reported, unlike previous experiences with metformin , which caused gastrointestinal discomfort. She reports hip pain that began over a month ago, described as deep pain in the hip, particularly noticeable during external rotation movements. The pain is persistent, affecting her comfort but not preventing her from performing daily activities. Some improvement in range of motion is noted, but discomfort persists when lying down and attempting certain movements. The pain is described as deep and can be felt 'all the way through' the hip.Her past medical history includes hypertension and acid reflux, and she no longer requires medication for these conditions after her weight loss. She was previously on medications for these conditions but no longer requires them. She also has a history of tendonitis in her elbow, which was successfully treated with a steroid and exercises.Her social history includes significant lifestyle  changes, such as quitting soda and smoking, which she attributes to her improved health. She works at [employer], which presents challenges in managing her eating habits due to constant exposure to food.       Patient Active Problem List   Diagnosis Date Noted   Right lateral epicondylitis 03/02/2023   Type 2 diabetes mellitus with hyperglycemia, without long-term current use of insulin (HCC) 05/26/2022   Morbid obesity (HCC) 11/24/2021   Iron deficiency anemia 07/22/2019   Gastroesophageal reflux disease 05/01/2018   Persistent cough 05/01/2018   Hyperglycemia 11/08/2017   Hypertension 10/10/2017   Past Medical History:  Diagnosis Date   Anxiety    GERD (gastroesophageal reflux disease)    Heart murmur    functional heart murmur--as a child   Hypertension    Iron deficiency anemia 07/22/2019   Past Surgical History:  Procedure Laterality Date   CHOLECYSTECTOMY     KNEE SURGERY Left    removal of benign tumor   REFRACTIVE SURGERY     TUBAL LIGATION     Social History   Tobacco Use   Smoking status: Former    Current packs/day: 0.00    Average packs/day: 1 pack/day for 7.0 years (7.0 ttl pk-yrs)    Types: Cigarettes    Start date: 34    Quit date: 1997    Years since quitting: 28.5   Smokeless tobacco: Former  Building services engineer status: Never Used  Substance Use Topics   Alcohol use: Yes    Alcohol/week: 3.0 - 4.0 standard drinks of alcohol    Types: 3 - 4 Glasses of wine per  week    Comment: 3-4/wk   Drug use: Never      ROS: as noted in HPI  Objective:     BP 121/80 (BP Location: Left Arm, Patient Position: Sitting, Cuff Size: Normal)   Pulse 67   Resp 18   Ht 5' 2 (1.575 m)   Wt 141 lb (64 kg)   LMP  (LMP Unknown)   SpO2 100%   BMI 25.79 kg/m  BP Readings from Last 3 Encounters:  08/30/23 121/80  03/22/23 (!) 157/79  03/09/23 131/81   Wt Readings from Last 3 Encounters:  08/30/23 141 lb (64 kg)  03/22/23 138 lb (62.6 kg)  03/02/23 139 lb 4  oz (63.2 kg)      Physical Exam Vitals and nursing note reviewed.  Constitutional:      General: She is not in acute distress.    Appearance: Normal appearance. She is not ill-appearing, toxic-appearing or diaphoretic.  HENT:     Head: Normocephalic and atraumatic.     Right Ear: Tympanic membrane, ear canal and external ear normal. There is no impacted cerumen.     Left Ear: Tympanic membrane, ear canal and external ear normal. There is no impacted cerumen.     Nose: Nose normal.     Mouth/Throat:     Mouth: Mucous membranes are moist.     Pharynx: Oropharynx is clear. No oropharyngeal exudate or posterior oropharyngeal erythema.  Eyes:     General: No scleral icterus.       Right eye: No discharge.        Left eye: No discharge.     Extraocular Movements: Extraocular movements intact.     Pupils: Pupils are equal, round, and reactive to light.  Neck:     Thyroid: No thyroid mass, thyromegaly or thyroid tenderness.  Cardiovascular:     Rate and Rhythm: Normal rate and regular rhythm.     Pulses: Normal pulses.     Heart sounds: No murmur heard. Pulmonary:     Effort: Pulmonary effort is normal. No respiratory distress.     Breath sounds: Normal breath sounds. No stridor. No wheezing or rhonchi.  Musculoskeletal:     Cervical back: Normal range of motion and neck supple. No rigidity or tenderness.     Right lower leg: No edema.     Left lower leg: No edema.     Comments: L hip pain with external rotation and hip flexion Positive FABER test  Lymphadenopathy:     Cervical: No cervical adenopathy.  Skin:    General: Skin is warm and dry.     Coloration: Skin is not jaundiced.     Findings: No bruising, erythema or rash.  Neurological:     General: No focal deficit present.     Mental Status: She is alert and oriented to person, place, and time.     Sensory: No sensory deficit.     Motor: No weakness.  Psychiatric:        Mood and Affect: Mood normal.        Behavior:  Behavior normal.      No results found for any visits on 08/30/23.  Last CBC Lab Results  Component Value Date   WBC 8.0 05/26/2022   HGB 15.8 (H) 05/26/2022   HCT 44.8 05/26/2022   MCV 94.7 05/26/2022   MCH 33.4 (H) 05/26/2022   RDW 11.9 05/26/2022   PLT 322 05/26/2022   Last metabolic panel Lab Results  Component Value  Date   GLUCOSE 78 03/02/2023   NA 142 03/02/2023   K 4.3 03/02/2023   CL 104 03/02/2023   CO2 23 03/02/2023   BUN 18 03/02/2023   CREATININE 0.71 03/02/2023   EGFR 102 03/02/2023   CALCIUM 9.7 03/02/2023   PROT 7.2 05/26/2022   BILITOT 0.8 05/26/2022   AST 23 05/26/2022   ALT 24 05/26/2022   ANIONGAP 12 09/20/2020   Last lipids Lab Results  Component Value Date   CHOL 170 05/26/2022   HDL 81 05/26/2022   LDLCALC 74 05/26/2022   TRIG 71 05/26/2022   CHOLHDL 2.1 05/26/2022   Last hemoglobin A1c Lab Results  Component Value Date   HGBA1C 5.0 03/02/2023   Last thyroid functions Lab Results  Component Value Date   TSH 1.80 05/02/2018   Last vitamin D  Lab Results  Component Value Date   VD25OH 12 (L) 11/24/2021   Last vitamin B12 and Folate Lab Results  Component Value Date   VITAMINB12 600 10/08/2020      The 10-year ASCVD risk score (Arnett DK, et al., 2019) is: 1.5%  Assessment & Plan:  Type 2 diabetes mellitus with hyperglycemia, without long-term current use of insulin (HCC) -     CBC with Differential/Platelet -     Hemoglobin A1c -     TSH -     Lipid panel -     Comprehensive metabolic panel with GFR -     Ozempic  (1 MG/DOSE); Inject 1 mg into the skin once a week.  Dispense: 3 mL; Refill: 6  Vitamin D  deficiency -     Comprehensive metabolic panel with GFR -     VITAMIN D  25 Hydroxy (Vit-D Deficiency, Fractures)  Hypercalcemia -     Comprehensive metabolic panel with GFR -     VITAMIN D  25 Hydroxy (Vit-D Deficiency, Fractures)  Left hip pain -     predniSONE ; Take by mouth daily. Take 6 tabs by mouth daily  for  1 days, then 5 tabs for 1 days, then 4 tabs for 1 days, then 3 tabs for 1 days, 2 tabs for 1 days, then 1 tab by mouth daily for 1 days  Dispense: 21 tablet; Refill: 0    Assessment and Plan    Hip Pain Persistent hip pain suggests possible labral tear. Pain improved but persists, indicating need for further evaluation if symptoms continue. - Prescribe prednisone  for six days. - Instruct on hip labral tear exercises at home. - Advise to contact via MyChart in 1-2 weeks if symptoms persist for further assessment, including potential x-ray and advanced imaging.  Diabetes Mellitus (in remission) Diabetes in remission with A1c of 5.0 due to weight loss and Ozempic . Ozempic  aids in controlling eating habits without significant side effects. - Continue Ozempic  at 1 mg with six refills.  Vitamin D  Deficiency Previous vitamin D  level was 12, affecting calcium levels. Reassessment needed.- Order blood work to recheck vitamin D  and calcium levels. - Consider vitamin D  supplementation based on current lab results.  General Health MaintenanceWeight loss resolved hypertension and acid reflux. Maintains healthy lifestyle by avoiding soda and smoking cessation. - Encourage continued healthy lifestyle choices, including avoiding soda and maintaining weight loss.  Follow-up Discussed importance of monitoring lab results to guide treatment decisions. - Order blood work today, including lipid panel, vitamin D , and calcium levels.- Review lab results in MyChart within 24-48 hours.- Schedule follow-up appointments as needed based on lab results and hip pain assessment.  Return in about 6 months (around 03/01/2024).   Benton LITTIE Gave, PA

## 2023-08-31 LAB — LIPID PANEL
Chol/HDL Ratio: 2.2 ratio (ref 0.0–4.4)
Cholesterol, Total: 191 mg/dL (ref 100–199)
HDL: 85 mg/dL (ref >39)
LDL Chol Calc (NIH): 93 mg/dL (ref 0–99)
Triglycerides: 70 mg/dL (ref 0–149)
VLDL Cholesterol Cal: 13 mg/dL (ref 5–40)

## 2023-08-31 LAB — COMPREHENSIVE METABOLIC PANEL WITH GFR
ALT: 18 IU/L (ref 0–32)
AST: 24 IU/L (ref 0–40)
Albumin: 4.5 g/dL (ref 3.8–4.9)
Alkaline Phosphatase: 90 IU/L (ref 44–121)
BUN/Creatinine Ratio: 22 (ref 9–23)
BUN: 17 mg/dL (ref 6–24)
Bilirubin Total: 0.6 mg/dL (ref 0.0–1.2)
CO2: 20 mmol/L (ref 20–29)
Calcium: 9.6 mg/dL (ref 8.7–10.2)
Chloride: 102 mmol/L (ref 96–106)
Creatinine, Ser: 0.79 mg/dL (ref 0.57–1.00)
Globulin, Total: 2.4 g/dL (ref 1.5–4.5)
Glucose: 85 mg/dL (ref 70–99)
Potassium: 4.8 mmol/L (ref 3.5–5.2)
Sodium: 140 mmol/L (ref 134–144)
Total Protein: 6.9 g/dL (ref 6.0–8.5)
eGFR: 89 mL/min/1.73 (ref >59)

## 2023-08-31 LAB — CBC WITH DIFFERENTIAL/PLATELET
Basophils Absolute: 0 x10E3/uL (ref 0.0–0.2)
Basos: 1 %
EOS (ABSOLUTE): 0.1 x10E3/uL (ref 0.0–0.4)
Eos: 2 %
Hematocrit: 45.5 % (ref 34.0–46.6)
Hemoglobin: 15.4 g/dL (ref 11.1–15.9)
Immature Grans (Abs): 0 x10E3/uL (ref 0.0–0.1)
Immature Granulocytes: 0 %
Lymphocytes Absolute: 1.9 x10E3/uL (ref 0.7–3.1)
Lymphs: 34 %
MCH: 33.2 pg — ABNORMAL HIGH (ref 26.6–33.0)
MCHC: 33.8 g/dL (ref 31.5–35.7)
MCV: 98 fL — ABNORMAL HIGH (ref 79–97)
Monocytes Absolute: 0.4 x10E3/uL (ref 0.1–0.9)
Monocytes: 8 %
Neutrophils Absolute: 3 x10E3/uL (ref 1.4–7.0)
Neutrophils: 55 %
Platelets: 276 x10E3/uL (ref 150–450)
RBC: 4.64 x10E6/uL (ref 3.77–5.28)
RDW: 11.8 % (ref 11.7–15.4)
WBC: 5.5 x10E3/uL (ref 3.4–10.8)

## 2023-08-31 LAB — VITAMIN D 25 HYDROXY (VIT D DEFICIENCY, FRACTURES): Vit D, 25-Hydroxy: 26.8 ng/mL — ABNORMAL LOW (ref 30.0–100.0)

## 2023-08-31 LAB — HEMOGLOBIN A1C
Est. average glucose Bld gHb Est-mCnc: 88 mg/dL
Hgb A1c MFr Bld: 4.7 % — ABNORMAL LOW (ref 4.8–5.6)

## 2023-08-31 LAB — TSH: TSH: 2.31 u[IU]/mL (ref 0.450–4.500)

## 2023-09-01 ENCOUNTER — Ambulatory Visit: Payer: Self-pay | Admitting: Urgent Care

## 2023-09-01 DIAGNOSIS — M25552 Pain in left hip: Secondary | ICD-10-CM

## 2023-09-07 ENCOUNTER — Ambulatory Visit

## 2023-09-07 ENCOUNTER — Ambulatory Visit: Payer: Self-pay | Admitting: Urgent Care

## 2023-09-07 DIAGNOSIS — M25552 Pain in left hip: Secondary | ICD-10-CM | POA: Diagnosis not present

## 2023-09-07 DIAGNOSIS — G8929 Other chronic pain: Secondary | ICD-10-CM | POA: Diagnosis not present

## 2023-09-19 ENCOUNTER — Ambulatory Visit: Admitting: Sports Medicine

## 2023-09-19 ENCOUNTER — Ambulatory Visit (INDEPENDENT_AMBULATORY_CARE_PROVIDER_SITE_OTHER)

## 2023-09-19 ENCOUNTER — Ambulatory Visit

## 2023-09-19 ENCOUNTER — Encounter: Payer: Self-pay | Admitting: Sports Medicine

## 2023-09-19 DIAGNOSIS — M7602 Gluteal tendinitis, left hip: Secondary | ICD-10-CM | POA: Diagnosis not present

## 2023-09-19 DIAGNOSIS — G8929 Other chronic pain: Secondary | ICD-10-CM

## 2023-09-19 DIAGNOSIS — M25552 Pain in left hip: Secondary | ICD-10-CM

## 2023-09-19 DIAGNOSIS — S73192A Other sprain of left hip, initial encounter: Secondary | ICD-10-CM | POA: Diagnosis not present

## 2023-09-19 DIAGNOSIS — M1612 Unilateral primary osteoarthritis, left hip: Secondary | ICD-10-CM | POA: Diagnosis not present

## 2023-09-19 MED ORDER — TRIAMCINOLONE ACETONIDE 40 MG/ML IJ SUSP
40.0000 mg | Freq: Once | INTRAMUSCULAR | Status: AC
  Administered 2023-09-19: 40 mg via INTRA_ARTICULAR

## 2023-09-19 MED ORDER — GADOBUTROL 1 MMOL/ML IV SOLN
1.0000 mL | Freq: Once | INTRAVENOUS | Status: AC | PRN
  Administered 2023-09-19: 1 mL

## 2023-09-19 NOTE — Addendum Note (Signed)
 Addended by: OLIVA-AVELLANEDA, Wasim Hurlbut L on: 09/19/2023 04:17 PM   Modules accepted: Orders

## 2023-09-19 NOTE — Assessment & Plan Note (Signed)
Injection performed today for MR arthrography, further management per primary treating provider. 

## 2023-09-19 NOTE — Progress Notes (Signed)
    Procedures performed today:    Procedure: Real-time Ultrasound Guided gadolinium contrast injection of left hip joint Device: Samsung [id number]  Verbal informed consent obtained.  Time-out conducted.  Noted no overlying erythema, induration, or other signs of local infection.  Skin prepped in a sterile fashion.  Local anesthesia: Topical Ethyl chloride.  With sterile technique and under real time ultrasound guidance: Noted normal-appearing joint, 22-gauge spinal needle advanced to the femoral head/neck junction, contacted bone, I then injected 1 cc kenalog  40, 2 cc lidocaine, 2 cc bupivacaine, syringe switched and 0.1 cc gadolinium injected, syringe again switched and 10 cc sterile saline used to fully distend the joint. Joint visualized and capsule seen distending confirming intra-articular placement of contrast material and medication. Completed without difficulty  Advised to call if fevers/chills, erythema, induration, drainage, or persistent bleeding.  Images permanently stored in PACS Impression: Technically successful ultrasound guided gadolinium contrast injection for MR arthrography.  Please see separate MR arthrogram report.  Independent interpretation of notes and tests performed by another provider:   None.  Brief History, Exam, Impression, and Recommendations:    Chronic left hip pain Injection performed today for MR arthrography, further management per primary treating provider.    ____________________________________________ Debby PARAS. Curtis, M.D., ABFM., CAQSM., AME. Primary Care and Sports Medicine [facility]  Adjunct Professor of [facility]  [employer]  Restaurant manager, fast food

## 2023-09-26 ENCOUNTER — Encounter: Payer: Self-pay | Admitting: Urgent Care

## 2023-09-26 DIAGNOSIS — S73192A Other sprain of left hip, initial encounter: Secondary | ICD-10-CM

## 2023-09-26 NOTE — Telephone Encounter (Signed)
 Do you need me to change the MRI to stat read?  If so , they are now requiring that we give them a reason to change to STAT read if you will let me know what the reason is if so.

## 2023-09-27 ENCOUNTER — Ambulatory Visit: Payer: Self-pay | Admitting: Urgent Care

## 2023-09-27 DIAGNOSIS — S73192A Other sprain of left hip, initial encounter: Secondary | ICD-10-CM

## 2023-09-28 MED ORDER — CELECOXIB 200 MG PO CAPS: 200.0000 mg | ORAL_CAPSULE | Freq: Two times a day (BID) | ORAL | 2 refills | Status: DC

## 2023-09-29 ENCOUNTER — Other Ambulatory Visit: Payer: Self-pay

## 2023-09-29 DIAGNOSIS — E1165 Type 2 diabetes mellitus with hyperglycemia: Secondary | ICD-10-CM

## 2023-09-29 MED ORDER — OZEMPIC (1 MG/DOSE) 4 MG/3ML ~~LOC~~ SOPN: 1.0000 mg | PEN_INJECTOR | SUBCUTANEOUS | 6 refills | Status: AC

## 2023-10-02 NOTE — Therapy (Unsigned)
 OUTPATIENT PHYSICAL THERAPY LOWER EXTREMITY EVALUATION   Patient Name: Monica Mcdaniel MRN: [id number] DOB:19-Dec-1970, 53 y.o., female Today's Date: 10/04/2023  END OF SESSION:  PT End of Session - 10/04/23 0808     Visit Number 1    Number of Visits 16    Date for PT Re-Evaluation 11/29/23    Authorization Type not available    PT Start Time 0715    PT Stop Time 0805    PT Time Calculation (min) 50 min    Activity Tolerance Patient tolerated treatment well          Past Medical History:  Diagnosis Date   Anxiety    GERD (gastroesophageal reflux disease)    Heart murmur    functional heart murmur--as a child   Hypertension    Iron deficiency anemia 07/22/2019   Past Surgical History:  Procedure Laterality Date   CHOLECYSTECTOMY     KNEE SURGERY Left    removal of benign tumor   REFRACTIVE SURGERY     TUBAL LIGATION     Patient Active Problem List   Diagnosis Date Noted   Chronic left hip pain 09/19/2023   Right lateral epicondylitis 03/02/2023   Type 2 diabetes mellitus with hyperglycemia, without long-term current use of insulin (HCC) 05/26/2022   Morbid obesity (HCC) 11/24/2021   Iron deficiency anemia 07/22/2019   Gastroesophageal reflux disease 05/01/2018   Persistent cough 05/01/2018   Hyperglycemia 11/08/2017   Hypertension 10/10/2017    PCP: Benton LITTIE Gave, PA  REFERRING PROVIDER: Benton LITTIE Gave, PA  REFERRING DIAG: Tear of L acetabular labrum   THERAPY DIAG:  Pain in left hip  Other symptoms and signs involving the musculoskeletal system  Muscle weakness (generalized)  Rationale for Evaluation and Treatment: Rehabilitation  ONSET DATE: 07/16/23; chronic pain ~ 2 years   SUBJECTIVE:   SUBJECTIVE STATEMENT: Patient reports that she has had L hip pain over the past two years. She feels she had a groin pull which was treated with steroid dowe pack. She has has stopped walking for her health and doing lower body workouts and symptoms have improved.  L hip pain described as a deep ache for the past 1-2 months. MRI 09/26/23 showed tear of anterior acetabular labrum (see results below). Has noticed some LBP the past couple of years which seems related to hip pain. Patient reports improvement in hip pain with initial exercises today.   PERTINENT HISTORY: HTN; AODM controlled with diet and exercise; acid reflux; tendinitis of elbow PAIN:  Are you having pain? Yes: NPRS scale: 5/10 Pain location: deep back of L hip  Pain description: nagging; dull; constant Aggravating factors: walking - pain the following day; standing > min; sitting >5-10 min  Relieving factors: movement; changing positions   PRECAUTIONS: None  RED FLAGS: None   WEIGHT BEARING RESTRICTIONS: No  FALLS:  Has patient fallen in last 6 months? No  LIVING ENVIRONMENT: Lives with: lives with their spouse Lives in: House/apartment Stairs: Yes: Internal: 12-14 steps; on left going up and External: 4-5 steps; can reach both Has following equipment at home: None  OCCUPATION: works at [employer]   PLOF: Independent  PATIENT GOALS: get back to walking for exercise; improve stability of L hip   NEXT MD VISIT: 02/20/23  OBJECTIVE:  Note: Objective measures were completed at Evaluation unless otherwise noted.  DIAGNOSTIC FINDINGS: MR L hip 09/27/23: Bones: No acute fracture. No dislocation. No femoral head avascular necrosis. Bony pelvis intact without diastasis. SI joints and  pubic symphysis within normal limits. No bone marrow edema. No marrow replacing bone lesion.   Articular cartilage and labrum   Articular cartilage: Mild chondral thinning along the superior aspect of the left hip joint without focal cartilage defect. No subchondral marrow signal changes.   Labrum: Superior labral tear (series 11, images 8-10). No paralabral cyst.   Joint or bursal effusion   Joint effusion:  Well distended with injected contrast.   Bursae: No abnormal bursal fluid  collection.   Muscles and tendons   Muscles and tendons: Mild tendinosis of the bilateral gluteus minimus tendons. The hamstring, iliopsoas, rectus femoris, and adductor tendons appear intact without tear or significant tendinosis. Normal muscle bulk and signal intensity without edema, atrophy, or fatty infiltration.   Other findings   Miscellaneous: No soft tissue edema or fluid collection. No inguinal lymphadenopathy.   IMPRESSION: 1. Mild left hip osteoarthritis with superior labral tear. 2. Mild tendinosis of the bilateral gluteus minimus tendons.  PATIENT SURVEYS:  PSFS: THE PATIENT SPECIFIC FUNCTIONAL SCALE  Place score of 0-10 (0 = unable to perform activity and 10 = able to perform activity at the same level as before injury or problem)  Activity Date: 10/04/23    Walking for exercise  0    2.Standing > 5-10 min  5    3.Lower body workout in gym 0    4. Stand on L LE (SLS) 0    Total Score 5      Total Score = Sum of activity scores/number of activities 5/4 = 1.25  Minimally Detectable Change: 3 points (for single activity); 2 points (for average score)  [facility] (nd). The Patient Specific Functional Scale . Retrieved from [email or web address]   COGNITION: Overall cognitive status: Within functional limits for tasks assessed     SENSATION: Tingling in L hip intermittently daily  Occasional numbness in the back of the leg to foot maybe 2x/wk  EDEMA:  None   MUSCLE LENGTH: Hamstrings: Right 70 deg; Left 65 deg Thomas test: Right -5 deg; Left -10 deg  POSTURE: rounded shoulders, forward head, and higher L than R hemipelvis, PSIS   PALPATION: Tenderness L iliacus; psoas; piriformis; gluts; QL; lats; lumbar paraspinals  LOWER EXTREMITY ROM: WFL's except L ER in supine ER ROM better with patient prone  Active ROM Right eval Left eval  Hip flexion    Hip extension    Hip abduction     Hip adduction    Hip internal rotation    Hip external rotation  Tight; pain  Knee flexion    Knee extension    Ankle dorsiflexion    Ankle plantarflexion    Ankle inversion    Ankle eversion     (Blank rows = not tested)  LOWER EXTREMITY MMT:  MMT Right eval Left eval  Hip flexion    Hip extension  4  Hip abduction  4+  Hip adduction    Hip internal rotation    Hip external rotation    Knee flexion    Knee extension    Ankle dorsiflexion    Ankle plantarflexion    Ankle inversion    Ankle eversion     (Blank rows = not tested)  LOWER EXTREMITY SPECIAL TESTS:  Hip special tests: Belvie (FABER) test: positive , Thomas test: positive for tightness and discomfort on L , and Piriformis test: positive for tightness and discomfort L   FUNCTIONAL TESTS:  5 times sit to stand: 9.33 sec no use  of UE's  SLS R 10 sec; L 6 sec more difficulty with balance   GAIT: Distance walked: 40 ft Assistive device utilized: None Level of assistance: Complete Independence Comments: WFL's                                                                                                                                 TREATMENT DATE: Eval; POC; HEP     PATIENT EDUCATION:  Education details: POC; HEP  Person educated: Patient Education method: Programmer, multimedia, Demonstration, Tactile cues, Verbal cues, and Handouts Education comprehension: verbalized understanding, returned demonstration, verbal cues required, tactile cues required, and needs further education  HOME EXERCISE PROGRAM: Access Code: [email or web address] URL: [email or web address] Date: 10/04/2023 Prepared by: Stanislav Gervase  Exercises - Prone Press Up  - 2 x daily - 7 x weekly - 1 sets - 10 reps - 2-3 sec  hold - Prone Press Up On Elbows  - 2 x daily - 7 x weekly - 1 sets - 3 reps - 30 sec  hold - Standing Lumbar Extension  - 2 x daily - 7 x weekly - 1 sets - 2-3 reps - 2-3 sec  hold - Supine Piriformis Stretch with Leg  Straight  - 2 x daily - 7 x weekly - 1 sets - 3 reps - 30 sec  hold - Hip Flexor Stretch at Edge of Bed  - 2 x daily - 7 x weekly - 1 sets - 3 reps - 30 sec  hold - Hooklying Hamstring Stretch with Strap  - 2 x daily - 7 x weekly - 1 sets - 3 reps - 30 sec  hold - Standing Piriformis Release with Ball at Wall  - 2 x daily - 7 x weekly - 30-60 sec  hold - Ball release psoas in prone; posterior hip supine   Patient Education - Hydrographic surveyor  ASSESSMENT:  CLINICAL IMPRESSION: Patient is a 53 y.o. female who was seen today for physical therapy evaluation and treatment for torn L acetabular labrum. She presents with history of L hip pain for the past two years with increased pain in the past two months. Patient has asymmetrical posture wit hL hemipelvis higher than R in standing; tightness in ROM L ER; weakness L hip compared to R; muscular tightness as noted above; pain with functional activities. Patient will benefit from skilled PT to address the problems identified.    OBJECTIVE IMPAIRMENTS: decreased activity tolerance, decreased ROM, decreased strength, impaired flexibility, improper body mechanics, postural dysfunction, and pain.   ACTIVITY LIMITATIONS: lifting, bending, sitting, standing, squatting, and locomotion level  PARTICIPATION LIMITATIONS: meal prep, cleaning, laundry, driving, and occupation  PERSONAL FACTORS: Past/current experiences, Profession, and Time since onset of injury/illness/exacerbation are also affecting patient's functional outcome.   REHAB POTENTIAL: Good  CLINICAL DECISION MAKING: Evolving/moderate complexity  EVALUATION COMPLEXITY: Moderate   GOALS: Goals reviewed with patient? Yes  SHORT TERM GOALS:  Target date: 11/01/2023  Independent in initial HEP  Baseline: Goal status: INITIAL  2.  Increase mobility with decreased pain in L hip rotation and extension allowing patient to perform exercises without difficulty   Baseline:  Goal status: INITIAL  3.  Increase strength L hip abduction and extension to 5/5  Baseline:  Goal status: INITIAL   LONG TERM GOALS: Target date: 11/29/2023   Patient reports decrease in L hip pain by 75-90% allowing her to perform 30-40 min of walking or exercise without increase in symptoms  Baseline:  Goal status: INITIAL  2.  Patient reports ability to sit for 1-2 hours without increase in pain when she stands up from sitting  Baseline:  Goal status: INITIAL  3.  Patient reports ability to stand and walk for 30-45 min without increase in pain from baseline  Baseline:  Goal status: INITIAL  4.  Patient demonstrates ability to stand on L LE for 10 + sec without loss of balance  Baseline:  Goal status: INITIAL  5.  IMPROVE PSFS: THE PATIENT SPECIFIC FUNCTIONAL SCALE SCORE BY 2 POINTS  Place score of 0-10 (0 = unable to perform activity and 10 = able to perform activity at the same level as before injury or problem)  Activity Date: 10/04/23    Walking for exercise  0    2.Standing > 5-10 min  5    3.Lower body workout in gym 0    4. Stand on L LE (SLS) 0    Total Score 5      Total Score = Sum of activity scores/number of activities 5/4 = 1.25  Minimally Detectable Change: 3 points (for single activity); 2 points (for average score) Baseline:  Goal status: INITIAL  6.  Independent in advanced HEP including aquatic program as indicated  Baseline:  Goal status: INITIAL   PLAN:  PT FREQUENCY: 2x/week  PT DURATION: 8 weeks  PLANNED INTERVENTIONS: 97164- PT Re-evaluation, 97110-Therapeutic exercises, 97530- Therapeutic activity, 97112- Neuromuscular re-education, 97535- Self Care, [id number]- Manual therapy, [phone]- Gait training, [phone]- Aquatic Therapy, Patient/Family education, Balance training, Stair training, Taping, and Joint mobilization  PLAN FOR NEXT SESSION: review and progress exercises; education and ergonomic recommendations; manual work and  modalities as indicated  Iliacus release    Earley Grobe SHAUNNA Baptist, PT 10/04/2023, 8:09 AM

## 2023-10-04 ENCOUNTER — Other Ambulatory Visit: Payer: Self-pay

## 2023-10-04 ENCOUNTER — Ambulatory Visit: Attending: Urgent Care | Admitting: Rehabilitative and Restorative Service Providers"

## 2023-10-04 ENCOUNTER — Encounter: Payer: Self-pay | Admitting: Rehabilitative and Restorative Service Providers"

## 2023-10-04 DIAGNOSIS — R29898 Other symptoms and signs involving the musculoskeletal system: Secondary | ICD-10-CM | POA: Diagnosis not present

## 2023-10-04 DIAGNOSIS — M25552 Pain in left hip: Secondary | ICD-10-CM | POA: Diagnosis not present

## 2023-10-04 DIAGNOSIS — M6281 Muscle weakness (generalized): Secondary | ICD-10-CM | POA: Diagnosis not present

## 2023-10-04 DIAGNOSIS — S73192A Other sprain of left hip, initial encounter: Secondary | ICD-10-CM | POA: Insufficient documentation

## 2023-10-10 ENCOUNTER — Ambulatory Visit: Admitting: Rehabilitative and Restorative Service Providers"

## 2023-10-10 ENCOUNTER — Encounter: Payer: Self-pay | Admitting: Rehabilitative and Restorative Service Providers"

## 2023-10-10 DIAGNOSIS — R29898 Other symptoms and signs involving the musculoskeletal system: Secondary | ICD-10-CM

## 2023-10-10 DIAGNOSIS — S73192A Other sprain of left hip, initial encounter: Secondary | ICD-10-CM | POA: Diagnosis not present

## 2023-10-10 DIAGNOSIS — M6281 Muscle weakness (generalized): Secondary | ICD-10-CM

## 2023-10-10 DIAGNOSIS — M25552 Pain in left hip: Secondary | ICD-10-CM | POA: Diagnosis not present

## 2023-10-10 NOTE — Therapy (Signed)
 OUTPATIENT PHYSICAL THERAPY LOWER EXTREMITY TREATMENT   Patient Name: Monica Mcdaniel MRN: [id number] DOB:07/27/1970, 53 y.o., female Today's Date: 10/10/2023  END OF SESSION:  PT End of Session - 10/10/23 0844     Visit Number 2    Number of Visits 16    Date for PT Re-Evaluation 11/29/23    Authorization Type not available    PT Start Time 0845    PT Stop Time 0830    PT Time Calculation (min) 1425 min          Past Medical History:  Diagnosis Date   Anxiety    GERD (gastroesophageal reflux disease)    Heart murmur    functional heart murmur--as a child   Hypertension    Iron deficiency anemia 07/22/2019   Past Surgical History:  Procedure Laterality Date   CHOLECYSTECTOMY     KNEE SURGERY Left    removal of benign tumor   REFRACTIVE SURGERY     TUBAL LIGATION     Patient Active Problem List   Diagnosis Date Noted   Chronic left hip pain 09/19/2023   Right lateral epicondylitis 03/02/2023   Type 2 diabetes mellitus with hyperglycemia, without long-term current use of insulin (HCC) 05/26/2022   Morbid obesity (HCC) 11/24/2021   Iron deficiency anemia 07/22/2019   Gastroesophageal reflux disease 05/01/2018   Persistent cough 05/01/2018   Hyperglycemia 11/08/2017   Hypertension 10/10/2017    PCP: Benton LITTIE Gave, PA  REFERRING PROVIDER: Benton LITTIE Gave, PA  REFERRING DIAG: Tear of L acetabular labrum   THERAPY DIAG:  Pain in left hip  Other symptoms and signs involving the musculoskeletal system  Muscle weakness (generalized)  Rationale for Evaluation and Treatment: Rehabilitation  ONSET DATE: 07/16/23; chronic pain ~ 2 years   SUBJECTIVE:   SUBJECTIVE STATEMENT: Patient reports that the exercises seem to help some but she continues to have pain on constant basis. Worse with sitting and driving.    Eval: Patient reports that she has had L hip pain over the past two years. She feels she had a groin pull which was treated with steroid dowe pack. She has  has stopped walking for her health and doing lower body workouts and symptoms have improved. L hip pain described as a deep ache for the past 1-2 months. MRI 09/26/23 showed tear of anterior acetabular labrum (see results below). Has noticed some LBP the past couple of years which seems related to hip pain. Patient reports improvement in hip pain with initial exercises today.   PERTINENT HISTORY: HTN; AODM controlled with diet and exercise; acid reflux; tendinitis of elbow PAIN:  Are you having pain? Yes: NPRS scale: 5/10 Pain location: deep back of L hip  Pain description: nagging; dull; constant Aggravating factors: walking - pain the following day; standing > min; sitting >5-10 min  Relieving factors: movement; changing positions   PRECAUTIONS: None   WEIGHT BEARING RESTRICTIONS: No  FALLS:  Has patient fallen in last 6 months? No  LIVING ENVIRONMENT: Lives with: lives with their spouse Lives in: House/apartment Stairs: Yes: Internal: 12-14 steps; on left going up and External: 4-5 steps; can reach both Has following equipment at home: None  OCCUPATION: works at [employer] - does a lot of driving    PATIENT GOALS: get back to walking for exercise; improve stability of L hip   NEXT MD VISIT: 02/20/23  OBJECTIVE:  Note: Objective measures were completed at Evaluation unless otherwise noted.  DIAGNOSTIC FINDINGS: MR L hip  09/27/23: Bones: No acute fracture. No dislocation. No femoral head avascular necrosis. Bony pelvis intact without diastasis. SI joints and pubic symphysis within normal limits. No bone marrow edema. No marrow replacing bone lesion.   Articular cartilage and labrum   Articular cartilage: Mild chondral thinning along the superior aspect of the left hip joint without focal cartilage defect. No subchondral marrow signal changes.   Labrum: Superior labral tear (series 11, images 8-10). No paralabral cyst.   Joint or bursal effusion   Joint  effusion:  Well distended with injected contrast.   Bursae: No abnormal bursal fluid collection.   Muscles and tendons   Muscles and tendons: Mild tendinosis of the bilateral gluteus minimus tendons. The hamstring, iliopsoas, rectus femoris, and adductor tendons appear intact without tear or significant tendinosis. Normal muscle bulk and signal intensity without edema, atrophy, or fatty infiltration.   Other findings   Miscellaneous: No soft tissue edema or fluid collection. No inguinal lymphadenopathy.   IMPRESSION: 1. Mild left hip osteoarthritis with superior labral tear. 2. Mild tendinosis of the bilateral gluteus minimus tendons.  PATIENT SURVEYS:  PSFS: THE PATIENT SPECIFIC FUNCTIONAL SCALE  Place score of 0-10 (0 = unable to perform activity and 10 = able to perform activity at the same level as before injury or problem)  Activity Date: 10/04/23    Walking for exercise  0    2.Standing > 5-10 min  5    3.Lower body workout in gym 0    4. Stand on L LE (SLS) 0    Total Score 5      Total Score = Sum of activity scores/number of activities 5/4 = 1.25  Minimally Detectable Change: 3 points (for single activity); 2 points (for average score)  [facility] (nd). The Patient Specific Functional Scale . Retrieved from [email or web address]     SENSATION: Tingling in L hip intermittently daily  Occasional numbness in the back of the leg to foot maybe 2x/wk  EDEMA:  None   MUSCLE LENGTH: Hamstrings: Right 70 deg; Left 65 deg Thomas test: Right -5 deg; Left -10 deg  POSTURE: rounded shoulders, forward head, and higher L than R hemipelvis, PSIS   PALPATION: Tenderness L iliacus; psoas; piriformis; gluts; QL; lats; lumbar paraspinals  LOWER EXTREMITY ROM: WFL's except L ER in supine ER ROM better with patient prone  Active ROM Right eval Left eval  Hip flexion    Hip extension    Hip abduction     Hip adduction    Hip internal rotation    Hip external rotation  Tight; pain  Knee flexion    Knee extension    Ankle dorsiflexion    Ankle plantarflexion    Ankle inversion    Ankle eversion     (Blank rows = not tested)  LOWER EXTREMITY MMT:  MMT Right eval Left eval  Hip flexion    Hip extension  4  Hip abduction  4+  Hip adduction    Hip internal rotation    Hip external rotation    Knee flexion    Knee extension    Ankle dorsiflexion    Ankle plantarflexion    Ankle inversion    Ankle eversion     (Blank rows = not tested)  LOWER EXTREMITY SPECIAL TESTS:  Hip special tests: Belvie (FABER) test: positive , Thomas test: positive for tightness and discomfort on L , and Piriformis test: positive for tightness and discomfort L   FUNCTIONAL TESTS:  5  times sit to stand: 9.33 sec no use of UE's  SLS R 10 sec; L 6 sec more difficulty with balance   GAIT: Distance walked: 40 ft Assistive device utilized: None Level of assistance: Complete Independence Comments: WFL's    [facility] Adult PT Treatment:                                                DATE: 10/10/23 Therapeutic Exercise: Prone press up 2-3 sec x 10  Standing hip extension 2-3 sec  Hip flexor stretch  Manual Therapy: TPR L > R iliacus Deep tissue work L anterior hip STM L gluts with IR/ER L hip knee flexed 90 deg hip extended  Neuromuscular re-ed: Myofacial stretch LEs to R; upper body to R stretching fascia and connective tissue  Therapeutic Activity: Hip flexor stretch supine thomas stretch 30 sec x 3  Piriformis stretch travel 30 sec x 3 L > R Diagonal knee to chest 30 sec x 2 L/R  ITB stretch sidelying PT assist 30 sec x 3 L  Self Care: Myofacial ball release work hip flexor prone; gluts supine using 4 inch plastic ball      PATIENT EDUCATION:  Education details: POC; HEP  Person educated: Patient Education method: Programmer, multimedia, Facilities manager, Actor cues, Verbal cues, and  Handouts Education comprehension: verbalized understanding, returned demonstration, verbal cues required, tactile cues required, and needs further education  HOME EXERCISE PROGRAM: Access Code: [email or web address] URL: [email or web address] Date: 10/10/2023 Prepared by: Bradyn Vassey  Exercises - Prone Press Up  - 2 x daily - 7 x weekly - 1 sets - 10 reps - 2-3 sec  hold - Prone Press Up On Elbows  - 2 x daily - 7 x weekly - 1 sets - 3 reps - 30 sec  hold - Standing Lumbar Extension  - 2 x daily - 7 x weekly - 1 sets - 2-3 reps - 2-3 sec  hold - Supine Piriformis Stretch with Leg Straight  - 2 x daily - 7 x weekly - 1 sets - 3 reps - 30 sec  hold - Hip Flexor Stretch at Edge of Bed  - 2 x daily - 7 x weekly - 1 sets - 3 reps - 30 sec  hold - Hooklying Hamstring Stretch with Strap  - 2 x daily - 7 x weekly - 1 sets - 3 reps - 30 sec  hold - Standing Piriformis Release with Ball at Wall  - 2 x daily - 7 x weekly - 30-60 sec  hold - Sidelying ITB Stretch  - 2 x daily - 7 x weekly - 1 sets - 3 reps - 30 sec  hold  Patient Education - Office Posture - Posture and Body Mechanics  ASSESSMENT:  CLINICAL IMPRESSION: Patient returns with persistent pain in the L hip. She reports that the hip feels some better with exercises - looser. Pain remains constant with variable intensity. Reviewed and progressed exercises. Working to tightness in the L hip with stretching and deep tissue release with some improvement in tissue extensibility noted. Patient has significant tightness in the L iliacus; psoas; gluts; piriformis. She has tightness in the ITB as well. Will continue with assessment and treatment. Progress with strengthening for L hip at next visit.   Eval: Patient is a 53 y.o. female who was seen today for physical therapy evaluation and  treatment for torn L acetabular labrum. She presents with history of L hip pain for the past two years with increased pain in the past two months. Patient has  asymmetrical posture with L hemipelvis higher than R in standing; tightness in ROM L ER; weakness L hip compared to R; muscular tightness as noted above; pain with functional activities. Patient will benefit from skilled PT to address the problems identified.    OBJECTIVE IMPAIRMENTS: decreased activity tolerance, decreased ROM, decreased strength, impaired flexibility, improper body mechanics, postural dysfunction, and pain.    GOALS: Goals reviewed with patient? Yes  SHORT TERM GOALS: Target date: 11/01/2023  Independent in initial HEP  Baseline: Goal status: INITIAL  2.  Increase mobility with decreased pain in L hip rotation and extension allowing patient to perform exercises without difficulty  Baseline:  Goal status: INITIAL  3.  Increase strength L hip abduction and extension to 5/5  Baseline:  Goal status: INITIAL   LONG TERM GOALS: Target date: 11/29/2023   Patient reports decrease in L hip pain by 75-90% allowing her to perform 30-40 min of walking or exercise without increase in symptoms  Baseline:  Goal status: INITIAL  2.  Patient reports ability to sit for 1-2 hours without increase in pain when she stands up from sitting  Baseline:  Goal status: INITIAL  3.  Patient reports ability to stand and walk for 30-45 min without increase in pain from baseline  Baseline:  Goal status: INITIAL  4.  Patient demonstrates ability to stand on L LE for 10 + sec without loss of balance  Baseline:  Goal status: INITIAL  5.  IMPROVE PSFS: THE PATIENT SPECIFIC FUNCTIONAL SCALE SCORE BY 2 POINTS  Place score of 0-10 (0 = unable to perform activity and 10 = able to perform activity at the same level as before injury or problem)  Activity Date: 10/04/23    Walking for exercise  0    2.Standing > 5-10 min  5    3.Lower body workout in gym 0    4. Stand on L LE (SLS) 0    Total Score 5      Total Score = Sum of activity scores/number of activities 5/4 = 1.25  Minimally  Detectable Change: 3 points (for single activity); 2 points (for average score) Baseline:  Goal status: INITIAL  6.  Independent in advanced HEP including aquatic program as indicated  Baseline:  Goal status: INITIAL   PLAN:  PT FREQUENCY: 2x/week  PT DURATION: 8 weeks  PLANNED INTERVENTIONS: 97164- PT Re-evaluation, 97110-Therapeutic exercises, 97530- Therapeutic activity, 97112- Neuromuscular re-education, 97535- Self Care, [id number]- Manual therapy, [phone]- Gait training, [phone]- Aquatic Therapy, Patient/Family education, Balance training, Stair training, Taping, and Joint mobilization  PLAN FOR NEXT SESSION: review and progress exercises; education and ergonomic recommendations; manual work and modalities as indicated  Iliacus release    Quintana Canelo SHAUNNA Baptist, PT 10/10/2023, 8:48 AM

## 2023-10-11 ENCOUNTER — Encounter: Payer: Self-pay | Admitting: Obstetrics and Gynecology

## 2023-10-11 ENCOUNTER — Ambulatory Visit (INDEPENDENT_AMBULATORY_CARE_PROVIDER_SITE_OTHER): Admitting: Obstetrics and Gynecology

## 2023-10-11 VITALS — BP 137/80 | HR 69 | Ht 62.0 in | Wt 141.0 lb

## 2023-10-11 DIAGNOSIS — N924 Excessive bleeding in the premenopausal period: Secondary | ICD-10-CM | POA: Diagnosis not present

## 2023-10-11 NOTE — Progress Notes (Signed)
   RETURN GYNECOLOGY VISIT  Subjective:  Monica Mcdaniel is a 53 y.o. menopausal [id number] on hormone therapy presenting with vaginal bleeding  Started hormone therapy ~ 6 months ago.  2 weeks ago had spotting and saw some small clots. Had bleeding for 5-7 days. Nothing heavy, was wearing a liner only.  Had an MRI and had to take her patch off for 1 day. Bleeding started a week later. Associated with cramping/back pain and loose stools. Symptoms resolved spontaneously.   Otherwise happy with HT. Hot flashes have resolved, rarely gets night sweats.   Objective:   Vitals:   10/11/23 0816 10/11/23 0820  BP: (!) 145/88 137/80  Pulse: 69 69  Weight: 141 lb (64 kg)   Height: 5' 2 (1.575 m)    General:  Alert, oriented and cooperative. Patient is in no acute distress.  Skin: Skin is warm and dry. No rash noted.   Cardiovascular: Normal heart rate noted  Respiratory: Normal respiratory effort, no problems with respiration noted  Abdomen: Soft, non-tender, non-distended    Assessment and Plan:  Monica Mcdaniel is a 53 y.o. with menopausal bleeding on HT  Will pursue workup as we are right at the 6 month mark since starting HT. Possibly iatrogenic bleeding related to stopping patch briefly. Will obtain pelvic US  to assess EL. Discussed potential ddx of iatrogenic bleeding, atrophic bleeding, polyp, hyperplasia/malignancy. Reviewed that if EL is not <4, plan for EMB.   Return for pending ultrasound results.  Future Appointments  Date Time Provider Department Center  10/13/2023  8:00 AM Tiffany Lamarr RAMAN, [place] [facility]  10/18/2023  7:15 AM Holt, Celyn P, PT [facility]  10/20/2023  8:00 AM Tiffany Lamarr RAMAN, PTA [facility]  10/24/2023  8:00 AM Holt, Celyn P, PT [facility]  10/27/2023  8:00 AM Lelon Tawni HERO, PT [facility]  10/30/2023  7:15 AM Ina Florene SQUIBB, PT [facility]  11/06/2023  8:00 AM Tiffany Lamarr RAMAN, PTA [facility]  11/08/2023  7:15 AM Ina Florene SQUIBB, PT [facility]  [facility]  11/14/2023  3:30 PM Zenaida Ronal FALCON, PT [facility] [id number]  11/16/2023  3:30 PM Zenaida Ronal FALCON, PT [facility] [phone] [address]  11/23/2023  4:15 PM Michaele Delon CROME [facility] [id number]  03/01/2024  7:40 AM Lowella Benton CROME, PA [facility] Bonni Kieth JAYSON Erik, MD

## 2023-10-12 ENCOUNTER — Ambulatory Visit: Admitting: Rehabilitative and Restorative Service Providers"

## 2023-10-13 ENCOUNTER — Ambulatory Visit (INDEPENDENT_AMBULATORY_CARE_PROVIDER_SITE_OTHER)

## 2023-10-13 ENCOUNTER — Ambulatory Visit

## 2023-10-13 DIAGNOSIS — S73192A Other sprain of left hip, initial encounter: Secondary | ICD-10-CM | POA: Diagnosis not present

## 2023-10-13 DIAGNOSIS — M25552 Pain in left hip: Secondary | ICD-10-CM | POA: Diagnosis not present

## 2023-10-13 DIAGNOSIS — M6281 Muscle weakness (generalized): Secondary | ICD-10-CM

## 2023-10-13 DIAGNOSIS — N924 Excessive bleeding in the premenopausal period: Secondary | ICD-10-CM

## 2023-10-13 DIAGNOSIS — N950 Postmenopausal bleeding: Secondary | ICD-10-CM

## 2023-10-13 DIAGNOSIS — D251 Intramural leiomyoma of uterus: Secondary | ICD-10-CM | POA: Diagnosis not present

## 2023-10-13 DIAGNOSIS — R29898 Other symptoms and signs involving the musculoskeletal system: Secondary | ICD-10-CM | POA: Diagnosis not present

## 2023-10-13 DIAGNOSIS — R9389 Abnormal findings on diagnostic imaging of other specified body structures: Secondary | ICD-10-CM | POA: Diagnosis not present

## 2023-10-13 NOTE — Therapy (Addendum)
 OUTPATIENT PHYSICAL THERAPY LOWER EXTREMITY TREATMENT   Patient Name: Monica Mcdaniel MRN: [id number] DOB:1970/06/30, 53 y.o., female Today's Date: 10/13/2023  END OF SESSION:  PT End of Session - 10/13/23 0800     Visit Number 3    Number of Visits 16    Date for PT Re-Evaluation 11/29/23    Authorization Type 5 PT visits from 10/10/2023 - 12/08/2023.    PT Start Time 0802    PT Stop Time 0847    PT Time Calculation (min) 45 min    Activity Tolerance Patient tolerated treatment well    Behavior During Therapy [facility] for tasks assessed/performed         Past Medical History:  Diagnosis Date   Anxiety    GERD (gastroesophageal reflux disease)    Heart murmur    functional heart murmur--as a child   Hypertension    Iron deficiency anemia 07/22/2019   Past Surgical History:  Procedure Laterality Date   CHOLECYSTECTOMY     KNEE SURGERY Left    removal of benign tumor   REFRACTIVE SURGERY     TUBAL LIGATION     Patient Active Problem List   Diagnosis Date Noted   Chronic left hip pain 09/19/2023   Right lateral epicondylitis 03/02/2023   Type 2 diabetes mellitus with hyperglycemia, without long-term current use of insulin (HCC) 05/26/2022   Morbid obesity (HCC) 11/24/2021   Iron deficiency anemia 07/22/2019   Gastroesophageal reflux disease 05/01/2018   Persistent cough 05/01/2018   Hyperglycemia 11/08/2017   Hypertension 10/10/2017    PCP: Benton LITTIE Gave, PA  REFERRING PROVIDER: Benton LITTIE Gave, PA  REFERRING DIAG: Tear of L acetabular labrum   THERAPY DIAG:  Pain in left hip  Other symptoms and signs involving the musculoskeletal system  Muscle weakness (generalized)  Rationale for Evaluation and Treatment: Rehabilitation  ONSET DATE: 07/16/23; chronic pain ~ 2 years   SUBJECTIVE:   SUBJECTIVE STATEMENT: Patient reports she has constant dull ache in hip joint.    Eval: Patient reports that she has had L hip pain over the past two years. She feels she had  a groin pull which was treated with steroid dowe pack. She has has stopped walking for her health and doing lower body workouts and symptoms have improved. L hip pain described as a deep ache for the past 1-2 months. MRI 09/26/23 showed tear of anterior acetabular labrum (see results below). Has noticed some LBP the past couple of years which seems related to hip pain. Patient reports improvement in hip pain with initial exercises today.   PERTINENT HISTORY: HTN; AODM controlled with diet and exercise; acid reflux; tendinitis of elbow  PAIN:  Are you having pain? Yes: NPRS scale: 3/10 Pain location: deep back of L hip  Pain description: nagging; dull; constant Aggravating factors: walking - pain the following day; standing > min; sitting >5-10 min  Relieving factors: movement; changing positions   PRECAUTIONS: None   WEIGHT BEARING RESTRICTIONS: No  FALLS:  Has patient fallen in last 6 months? No  LIVING ENVIRONMENT: Lives with: lives with their spouse Lives in: House/apartment Stairs: Yes: Internal: 12-14 steps; on left going up and External: 4-5 steps; can reach both Has following equipment at home: None  OCCUPATION: works at [employer] - does a lot of driving    PATIENT GOALS: get back to walking for exercise; improve stability of L hip   NEXT MD VISIT: 02/20/23  OBJECTIVE:  Note: Objective measures were completed  at Evaluation unless otherwise noted.  DIAGNOSTIC FINDINGS: MR L hip 09/27/23: Bones: No acute fracture. No dislocation. No femoral head avascular necrosis. Bony pelvis intact without diastasis. SI joints and pubic symphysis within normal limits. No bone marrow edema. No marrow replacing bone lesion.   Articular cartilage and labrum   Articular cartilage: Mild chondral thinning along the superior aspect of the left hip joint without focal cartilage defect. No subchondral marrow signal changes.   Labrum: Superior labral tear (series 11, images 8-10). No  paralabral cyst.   Joint or bursal effusion   Joint effusion:  Well distended with injected contrast.   Bursae: No abnormal bursal fluid collection.   Muscles and tendons   Muscles and tendons: Mild tendinosis of the bilateral gluteus minimus tendons. The hamstring, iliopsoas, rectus femoris, and adductor tendons appear intact without tear or significant tendinosis. Normal muscle bulk and signal intensity without edema, atrophy, or fatty infiltration.   Other findings   Miscellaneous: No soft tissue edema or fluid collection. No inguinal lymphadenopathy.   IMPRESSION: 1. Mild left hip osteoarthritis with superior labral tear. 2. Mild tendinosis of the bilateral gluteus minimus tendons.  PATIENT SURVEYS:  PSFS: THE PATIENT SPECIFIC FUNCTIONAL SCALE  Place score of 0-10 (0 = unable to perform activity and 10 = able to perform activity at the same level as before injury or problem)  Activity Date: 10/04/23    Walking for exercise  0    2.Standing > 5-10 min  5    3.Lower body workout in gym 0    4. Stand on L LE (SLS) 0    Total Score 5      Total Score = Sum of activity scores/number of activities 5/4 = 1.25  Minimally Detectable Change: 3 points (for single activity); 2 points (for average score)  [facility] (nd). The Patient Specific Functional Scale . Retrieved from [email or web address]     SENSATION: Tingling in L hip intermittently daily  Occasional numbness in the back of the leg to foot maybe 2x/wk  EDEMA:  None   MUSCLE LENGTH: Hamstrings: Right 70 deg; Left 65 deg Thomas test: Right -5 deg; Left -10 deg  POSTURE: rounded shoulders, forward head, and higher L than R hemipelvis, PSIS   PALPATION: Tenderness L iliacus; psoas; piriformis; gluts; QL; lats; lumbar paraspinals  LOWER EXTREMITY ROM: WFL's except L ER in supine ER ROM better with patient prone  Active ROM Right eval  Left eval  Hip flexion    Hip extension    Hip abduction    Hip adduction    Hip internal rotation    Hip external rotation  Tight; pain  Knee flexion    Knee extension    Ankle dorsiflexion    Ankle plantarflexion    Ankle inversion    Ankle eversion     (Blank rows = not tested)  LOWER EXTREMITY MMT:  MMT Right eval Left eval  Hip flexion    Hip extension  4  Hip abduction  4+  Hip adduction    Hip internal rotation    Hip external rotation    Knee flexion    Knee extension    Ankle dorsiflexion    Ankle plantarflexion    Ankle inversion    Ankle eversion     (Blank rows = not tested)  LOWER EXTREMITY SPECIAL TESTS:  Hip special tests: Belvie (FABER) test: positive , Thomas test: positive for tightness and discomfort on L , and Piriformis test: positive  for tightness and discomfort L   FUNCTIONAL TESTS:  5 times sit to stand: 9.33 sec no use of UE's  SLS R 10 sec; L 6 sec more difficulty with balance   GAIT: Distance walked: 40 ft Assistive device utilized: None Level of assistance: Complete Independence Comments: WFL's     [facility] Adult PT Treatment:                                                DATE: 10/12/2023 Therapeutic Exercise: Figure 4 stretch  Supine piriformis stretch  Self-massage/TPR with green spiky ball -- piriformis Neuromuscular re-ed: Hooklying hip add isometric + ball squeeze x10 Hip abd isometric + blue TB x5 Bridges + black TB x10 --> staggered stance x10 Prone hip extension straight leg x10  Prone bent knee hip extension x10  Bent over on counter + hip extension on diagonal  Glute med press against block on wall (bil) Therapeutic Activity: Prone press up x10 Prone prop on elbows + knee flexion x10 (Lt) Modified single leg dead lift (Lt)    [facility] Adult PT Treatment:                                                DATE: 10/10/23 Therapeutic Exercise: Prone press up 2-3 sec x 10  Standing hip extension 2-3 sec  Hip flexor  stretch  Manual Therapy: TPR L > R iliacus Deep tissue work L anterior hip STM L gluts with IR/ER L hip knee flexed 90 deg hip extended  Neuromuscular re-ed: Myofacial stretch LEs to R; upper body to R stretching fascia and connective tissue  Therapeutic Activity: Hip flexor stretch supine thomas stretch 30 sec x 3  Piriformis stretch travel 30 sec x 3 L > R Diagonal knee to chest 30 sec x 2 L/R  ITB stretch sidelying PT assist 30 sec x 3 L  Self Care: Myofacial ball release work hip flexor prone; gluts supine using 4 inch plastic ball     PATIENT EDUCATION:  Education details: Updated HEP  Person educated: Patient Education method: Programmer, multimedia, Demonstration, Actor cues, Verbal cues, and Handouts Education comprehension: verbalized understanding, returned demonstration, verbal cues required, tactile cues required, and needs further education  HOME EXERCISE PROGRAM: Access Code: [email or web address] URL: [email or web address] Date: 10/13/2023 Prepared by: Lamarr Price  Exercises - Prone Press Up  - 2 x daily - 7 x weekly - 1 sets - 10 reps - 2-3 sec  hold - Prone Press Up On Elbows  - 2 x daily - 7 x weekly - 1 sets - 3 reps - 30 sec  hold - Standing Lumbar Extension  - 2 x daily - 7 x weekly - 1 sets - 2-3 reps - 2-3 sec  hold - Supine Piriformis Stretch with Leg Straight  - 2 x daily - 7 x weekly - 1 sets - 3 reps - 30 sec  hold - Hip Flexor Stretch at Edge of Bed  - 2 x daily - 7 x weekly - 1 sets - 3 reps - 30 sec  hold - Hooklying Hamstring Stretch with Strap  - 2 x daily - 7 x weekly - 1 sets - 3 reps - 30 sec  hold - Standing  Piriformis Release with Ball at Wall  - 2 x daily - 7 x weekly - 30-60 sec  hold - Sidelying ITB Stretch  - 2 x daily - 7 x weekly - 1 sets - 3 reps - 30 sec  hold - Isometric Gluteus Medius at Wall  - 1 x daily - 7 x weekly - 1-2 sets - 10 reps - 5 sec hold  Patient Education - Office Posture  ASSESSMENT:  CLINICAL IMPRESSION: Poor  glute activation with prone and standing hip extension; good glute activation with standing single leg press against wall. Continued self-massage to decrease myofascial tension in Lt piriformis.   Eval: Patient is a 53 y.o. female who was seen today for physical therapy evaluation and treatment for torn L acetabular labrum. She presents with history of L hip pain for the past two years with increased pain in the past two months. Patient has asymmetrical posture with L hemipelvis higher than R in standing; tightness in ROM L ER; weakness L hip compared to R; muscular tightness as noted above; pain with functional activities. Patient will benefit from skilled PT to address the problems identified.    OBJECTIVE IMPAIRMENTS: decreased activity tolerance, decreased ROM, decreased strength, impaired flexibility, improper body mechanics, postural dysfunction, and pain.    GOALS: Goals reviewed with patient? Yes  SHORT TERM GOALS: Target date: 11/01/2023  Independent in initial HEP  Baseline: Goal status: INITIAL  2.  Increase mobility with decreased pain in L hip rotation and extension allowing patient to perform exercises without difficulty  Baseline:  Goal status: INITIAL  3.  Increase strength L hip abduction and extension to 5/5  Baseline:  Goal status: INITIAL   LONG TERM GOALS: Target date: 11/29/2023  Patient reports decrease in L hip pain by 75-90% allowing her to perform 30-40 min of walking or exercise without increase in symptoms  Baseline:  Goal status: INITIAL  2.  Patient reports ability to sit for 1-2 hours without increase in pain when she stands up from sitting  Baseline:  Goal status: INITIAL  3.  Patient reports ability to stand and walk for 30-45 min without increase in pain from baseline  Baseline:  Goal status: INITIAL  4.  Patient demonstrates ability to stand on L LE for 10 + sec without loss of balance  Baseline:  Goal status: INITIAL  5.  IMPROVE PSFS: THE  PATIENT SPECIFIC FUNCTIONAL SCALE SCORE BY 2 POINTS  Place score of 0-10 (0 = unable to perform activity and 10 = able to perform activity at the same level as before injury or problem)  Activity Date: 10/04/23    Walking for exercise  0    2.Standing > 5-10 min  5    3.Lower body workout in gym 0    4. Stand on L LE (SLS) 0    Total Score 5      Total Score = Sum of activity scores/number of activities 5/4 = 1.25  Minimally Detectable Change: 3 points (for single activity); 2 points (for average score) Baseline:  Goal status: INITIAL  6.  Independent in advanced HEP including aquatic program as indicated  Baseline:  Goal status: INITIAL   PLAN:  PT FREQUENCY: 2x/week  PT DURATION: 8 weeks  PLANNED INTERVENTIONS: 97164- PT Re-evaluation, 97110-Therapeutic exercises, 97530- Therapeutic activity, [id number]- Neuromuscular re-education, 97535- Self Care, [id number]- Manual therapy, [phone]- Gait training, [phone]- Aquatic Therapy, Patient/Family education, Balance training, Stair training, Taping, and Joint mobilization  PLAN FOR NEXT SESSION: review  and progress exercises; education and ergonomic recommendations; manual work and modalities as indicated  Iliacus release    Lamarr GORMAN Price, PTA 10/13/2023, 8:47 AM

## 2023-10-17 ENCOUNTER — Encounter: Payer: Self-pay | Admitting: Sports Medicine

## 2023-10-18 ENCOUNTER — Encounter: Payer: Self-pay | Admitting: Rehabilitative and Restorative Service Providers"

## 2023-10-18 ENCOUNTER — Ambulatory Visit: Attending: Urgent Care | Admitting: Rehabilitative and Restorative Service Providers"

## 2023-10-18 DIAGNOSIS — M25552 Pain in left hip: Secondary | ICD-10-CM | POA: Diagnosis not present

## 2023-10-18 DIAGNOSIS — R29898 Other symptoms and signs involving the musculoskeletal system: Secondary | ICD-10-CM | POA: Diagnosis not present

## 2023-10-18 DIAGNOSIS — M6281 Muscle weakness (generalized): Secondary | ICD-10-CM | POA: Diagnosis not present

## 2023-10-18 NOTE — Therapy (Signed)
 OUTPATIENT PHYSICAL THERAPY LOWER EXTREMITY TREATMENT   Patient Name: Monica Mcdaniel MRN: [id number] DOB:Jun 21, 1970, 53 y.o., female Today's Date: 10/18/2023  END OF SESSION:  PT End of Session - 10/18/23 0710     Visit Number 4    Number of Visits 16    Date for PT Re-Evaluation 11/29/23    Authorization Type 5 PT visits from 10/10/2023 - 12/08/2023.    Authorization - Visit Number 4    Authorization - Number of Visits 5    PT Start Time 0710    PT Stop Time 0800    PT Time Calculation (min) 50 min    Activity Tolerance Patient tolerated treatment well         Past Medical History:  Diagnosis Date   Anxiety    GERD (gastroesophageal reflux disease)    Heart murmur    functional heart murmur--as a child   Hypertension    Iron deficiency anemia 07/22/2019   Past Surgical History:  Procedure Laterality Date   CHOLECYSTECTOMY     KNEE SURGERY Left    removal of benign tumor   REFRACTIVE SURGERY     TUBAL LIGATION     Patient Active Problem List   Diagnosis Date Noted   Chronic left hip pain 09/19/2023   Right lateral epicondylitis 03/02/2023   Type 2 diabetes mellitus with hyperglycemia, without long-term current use of insulin (HCC) 05/26/2022   Morbid obesity (HCC) 11/24/2021   Iron deficiency anemia 07/22/2019   Gastroesophageal reflux disease 05/01/2018   Persistent cough 05/01/2018   Hyperglycemia 11/08/2017   Hypertension 10/10/2017    PCP: Benton LITTIE Gave, PA  REFERRING PROVIDER: Benton LITTIE Gave, PA  REFERRING DIAG: Tear of L acetabular labrum   THERAPY DIAG:  Pain in left hip  Other symptoms and signs involving the musculoskeletal system  Muscle weakness (generalized)  Rationale for Evaluation and Treatment: Rehabilitation  ONSET DATE: 07/16/23; chronic pain ~ 2 years   SUBJECTIVE:   SUBJECTIVE STATEMENT: Patient reports she has improved some. Can tell the gluts are not as bad. She no longer has costant pain in the L hip. She still has the constant  dull ache in hip joint.    Eval: Patient reports that she has had L hip pain over the past two years. She feels she had a groin pull which was treated with steroid dowe pack. She has has stopped walking for her health and doing lower body workouts and symptoms have improved. L hip pain described as a deep ache for the past 1-2 months. MRI 09/26/23 showed tear of anterior acetabular labrum (see results below). Has noticed some LBP the past couple of years which seems related to hip pain. Patient reports improvement in hip pain with initial exercises today.   PERTINENT HISTORY: HTN; AODM controlled with diet and exercise; acid reflux; tendinitis of elbow  PAIN:  Are you having pain? Yes: NPRS scale: 3-4/10 Pain location: deep back of L hip  Pain description: nagging; dull; constant Aggravating factors: walking - pain the following day; standing > min; sitting >5-10 min  Relieving factors: movement; changing positions   PRECAUTIONS: None   WEIGHT BEARING RESTRICTIONS: No  FALLS:  Has patient fallen in last 6 months? No  LIVING ENVIRONMENT: Lives with: lives with their spouse Lives in: House/apartment Stairs: Yes: Internal: 12-14 steps; on left going up and External: 4-5 steps; can reach both Has following equipment at home: None  OCCUPATION: works at [employer] - does a lot of  driving    PATIENT GOALS: get back to walking for exercise; improve stability of L hip   NEXT MD VISIT: 02/20/23  OBJECTIVE:  Note: Objective measures were completed at Evaluation unless otherwise noted.  DIAGNOSTIC FINDINGS: MR L hip 09/27/23: Bones: No acute fracture. No dislocation. No femoral head avascular necrosis. Bony pelvis intact without diastasis. SI joints and pubic symphysis within normal limits. No bone marrow edema. No marrow replacing bone lesion.   Articular cartilage and labrum   Articular cartilage: Mild chondral thinning along the superior aspect of the left hip joint  without focal cartilage defect. No subchondral marrow signal changes.   Labrum: Superior labral tear (series 11, images 8-10). No paralabral cyst.   Joint or bursal effusion   Joint effusion:  Well distended with injected contrast.   Bursae: No abnormal bursal fluid collection.   Muscles and tendons   Muscles and tendons: Mild tendinosis of the bilateral gluteus minimus tendons. The hamstring, iliopsoas, rectus femoris, and adductor tendons appear intact without tear or significant tendinosis. Normal muscle bulk and signal intensity without edema, atrophy, or fatty infiltration.   Other findings   Miscellaneous: No soft tissue edema or fluid collection. No inguinal lymphadenopathy.   IMPRESSION: 1. Mild left hip osteoarthritis with superior labral tear. 2. Mild tendinosis of the bilateral gluteus minimus tendons.  PATIENT SURVEYS:  PSFS: THE PATIENT SPECIFIC FUNCTIONAL SCALE  Place score of 0-10 (0 = unable to perform activity and 10 = able to perform activity at the same level as before injury or problem)  Activity Date: 10/04/23    Walking for exercise  0    2.Standing > 5-10 min  5    3.Lower body workout in gym 0    4. Stand on L LE (SLS) 0    Total Score 5      Total Score = Sum of activity scores/number of activities 5/4 = 1.25  Minimally Detectable Change: 3 points (for single activity); 2 points (for average score)  [facility] (nd). The Patient Specific Functional Scale . Retrieved from [email or web address]     SENSATION: Tingling in L hip intermittently daily  Occasional numbness in the back of the leg to foot maybe 2x/wk  EDEMA:  None   MUSCLE LENGTH: Hamstrings: Right 70 deg; Left 65 deg Thomas test: Right -5 deg; Left -10 deg  POSTURE: rounded shoulders, forward head, and higher L than R hemipelvis, PSIS   PALPATION: Tenderness L iliacus; psoas; piriformis; gluts; QL; lats;  lumbar paraspinals  LOWER EXTREMITY ROM: WFL's except L ER in supine ER ROM better with patient prone  Active ROM Right eval Left eval  Hip flexion    Hip extension    Hip abduction    Hip adduction    Hip internal rotation    Hip external rotation  Tight; pain  Knee flexion    Knee extension    Ankle dorsiflexion    Ankle plantarflexion    Ankle inversion    Ankle eversion     (Blank rows = not tested)  LOWER EXTREMITY MMT:  MMT Right eval Left eval  Hip flexion    Hip extension  4  Hip abduction  4+  Hip adduction    Hip internal rotation    Hip external rotation    Knee flexion    Knee extension    Ankle dorsiflexion    Ankle plantarflexion    Ankle inversion    Ankle eversion     (Blank rows =  not tested)  LOWER EXTREMITY SPECIAL TESTS:  Hip special tests: Belvie (FABER) test: positive , Thomas test: positive for tightness and discomfort on L , and Piriformis test: positive for tightness and discomfort L   FUNCTIONAL TESTS:  5 times sit to stand: 9.33 sec no use of UE's  SLS R 10 sec; L 6 sec more difficulty with balance   GAIT: Distance walked: 40 ft Assistive device utilized: None Level of assistance: Complete Independence Comments: WFL's   [facility] Adult PT Treatment:                                                DATE: 10/18/23 Therapeutic Exercise: Standing hip extension 2-3 sec  Hip flexor stretch  Manual Therapy: TPR L > R iliacus Deep tissue work L anterior hip STM L gluts with IR/ER L hip knee flexed 90 deg hip extended  Neuromuscular re-ed:  Therapeutic Activity: Hip flexor stretch supine thomas stretch 30 sec x 3  Hip flexor stretch sitting lower surface 30 sec x 2  Piriformis stretch travel 30 sec x 3 L > R ITB stretch sidelying PT assist 30 sec x 3 L  Self Care: Myofacial ball release work hip flexor prone; gluts supine using 4 inch plastic ball    [facility] Adult PT Treatment:                                                DATE:  10/12/2023 Therapeutic Exercise: Figure 4 stretch  Supine piriformis stretch  Self-massage/TPR with green spiky ball -- piriformis Neuromuscular re-ed: Hooklying hip add isometric + ball squeeze x10 Hip abd isometric + blue TB x5 Bridges + black TB x10 --> staggered stance x10 Prone hip extension straight leg x10  Prone bent knee hip extension x10  Bent over on counter + hip extension on diagonal  Glute med press against block on wall (bil) Therapeutic Activity: Prone press up x10 Prone prop on elbows + knee flexion x10 (Lt) Modified single leg dead lift (Lt)    [facility] Adult PT Treatment:                                                DATE: 10/10/23 Therapeutic Exercise: Prone press up 2-3 sec x 10  Standing hip extension 2-3 sec  Hip flexor stretch  Manual Therapy: TPR L > R iliacus Deep tissue work L anterior hip STM L gluts with IR/ER L hip knee flexed 90 deg hip extended  Neuromuscular re-ed: Myofacial stretch LEs to R; upper body to R stretching fascia and connective tissue  Therapeutic Activity: Hip flexor stretch supine thomas stretch 30 sec x 3  Piriformis stretch travel 30 sec x 3 L > R Diagonal knee to chest 30 sec x 2 L/R  ITB stretch sidelying PT assist 30 sec x 3 L  Self Care: Myofacial ball release work hip flexor prone; gluts supine using 4 inch plastic ball     PATIENT EDUCATION:  Education details: Updated HEP  Person educated: Patient Education method: Explanation, Demonstration, Actor cues, Verbal cues, and Handouts Education comprehension: verbalized  understanding, returned demonstration, verbal cues required, tactile cues required, and needs further education  HOME EXERCISE PROGRAM: Access Code: [email or web address] URL: [email or web address] Date: 10/18/2023 Prepared by: Smt. Loder  Exercises - Prone Press Up  - 2 x daily - 7 x weekly - 1 sets - 10 reps - 2-3 sec  hold - Prone Press Up On Elbows  - 2 x daily - 7 x weekly - 1 sets - 3 reps -  30 sec  hold - Standing Lumbar Extension  - 2 x daily - 7 x weekly - 1 sets - 2-3 reps - 2-3 sec  hold - Supine Piriformis Stretch with Leg Straight  - 2 x daily - 7 x weekly - 1 sets - 3 reps - 30 sec  hold - Hip Flexor Stretch at Edge of Bed  - 2 x daily - 7 x weekly - 1 sets - 3 reps - 30 sec  hold - Hooklying Hamstring Stretch with Strap  - 2 x daily - 7 x weekly - 1 sets - 3 reps - 30 sec  hold - Standing Piriformis Release with Ball at Wall  - 2 x daily - 7 x weekly - 30-60 sec  hold - Sidelying ITB Stretch  - 2 x daily - 7 x weekly - 1 sets - 3 reps - 30 sec  hold - Seated Hip Flexor Stretch  - 2 x daily - 7 x weekly - 1 sets - 3 reps - 30 sec  hold - Prone Quadriceps Stretch with Strap  - 2 x daily - 7 x weekly - 1 sets - 3 reps - 30 sec  hold - Isometric Gluteus Medius at Wall  - 1 x daily - 7 x weekly - 1-2 sets - 10 reps - 5 sec hold - Sidelying Hip Abduction  - 1 x daily - 7 x weekly - 3 sets - 10 reps - 3-5 sec  hold - Supine Bridge with Resistance Band  - 2 x daily - 7 x weekly - 1-2 sets - 10 reps - 5-10 sec  hold - Anti-Rotation Lateral Stepping with Press  - 2 x daily - 7 x weekly - 1-2 sets - 10 reps - 2-3 sec  hold  Patient Education - Office Posture  ASSESSMENT:  CLINICAL IMPRESSION: Working with manual techniques to release tight iliacus and psoas as well as piriformis/gluts. Added facilitation of glut med/max. Decreased pain in L hip area. Continued tightness in the L > R iliacus. Added specific strengthening for glut med; glut max. Will continue with myofacial release through the psoas and posterior hip.   Eval: Patient is a 53 y.o. female who was seen today for physical therapy evaluation and treatment for torn L acetabular labrum. She presents with history of L hip pain for the past two years with increased pain in the past two months. Patient has asymmetrical posture with L hemipelvis higher than R in standing; tightness in ROM L ER; weakness L hip compared to R;  muscular tightness as noted above; pain with functional activities. Patient will benefit from skilled PT to address the problems identified.    OBJECTIVE IMPAIRMENTS: decreased activity tolerance, decreased ROM, decreased strength, impaired flexibility, improper body mechanics, postural dysfunction, and pain.    GOALS: Goals reviewed with patient? Yes  SHORT TERM GOALS: Target date: 11/01/2023  Independent in initial HEP  Baseline: Goal status: INITIAL  2.  Increase mobility with decreased pain in L hip rotation and extension allowing patient to  perform exercises without difficulty  Baseline:  Goal status: INITIAL  3.  Increase strength L hip abduction and extension to 5/5  Baseline:  Goal status: INITIAL   LONG TERM GOALS: Target date: 11/29/2023  Patient reports decrease in L hip pain by 75-90% allowing her to perform 30-40 min of walking or exercise without increase in symptoms  Baseline:  Goal status: INITIAL  2.  Patient reports ability to sit for 1-2 hours without increase in pain when she stands up from sitting  Baseline:  Goal status: INITIAL  3.  Patient reports ability to stand and walk for 30-45 min without increase in pain from baseline  Baseline:  Goal status: INITIAL  4.  Patient demonstrates ability to stand on L LE for 10 + sec without loss of balance  Baseline:  Goal status: INITIAL  5.  IMPROVE PSFS: THE PATIENT SPECIFIC FUNCTIONAL SCALE SCORE BY 2 POINTS  Place score of 0-10 (0 = unable to perform activity and 10 = able to perform activity at the same level as before injury or problem)  Activity Date: 10/04/23    Walking for exercise  0    2.Standing > 5-10 min  5    3.Lower body workout in gym 0    4. Stand on L LE (SLS) 0    Total Score 5      Total Score = Sum of activity scores/number of activities 5/4 = 1.25  Minimally Detectable Change: 3 points (for single activity); 2 points (for average score) Baseline:  Goal status: INITIAL  6.   Independent in advanced HEP including aquatic program as indicated  Baseline:  Goal status: INITIAL   PLAN:  PT FREQUENCY: 2x/week  PT DURATION: 8 weeks  PLANNED INTERVENTIONS: 97164- PT Re-evaluation, 97110-Therapeutic exercises, 97530- Therapeutic activity, 97112- Neuromuscular re-education, 97535- Self Care, [id number]- Manual therapy, [phone]- Gait training, [phone]- Aquatic Therapy, Patient/Family education, Balance training, Stair training, Taping, and Joint mobilization  PLAN FOR NEXT SESSION: review and progress exercises; education and ergonomic recommendations; manual work and modalities as indicated  Iliacus release    Jaysion Ramseyer SHAUNNA Baptist, PT 10/18/2023, 12:17 PM

## 2023-10-20 ENCOUNTER — Ambulatory Visit

## 2023-10-24 ENCOUNTER — Ambulatory Visit: Admitting: Rehabilitative and Restorative Service Providers"

## 2023-10-24 ENCOUNTER — Encounter: Payer: Self-pay | Admitting: Rehabilitative and Restorative Service Providers"

## 2023-10-24 DIAGNOSIS — R29898 Other symptoms and signs involving the musculoskeletal system: Secondary | ICD-10-CM | POA: Diagnosis not present

## 2023-10-24 DIAGNOSIS — M6281 Muscle weakness (generalized): Secondary | ICD-10-CM

## 2023-10-24 DIAGNOSIS — M25552 Pain in left hip: Secondary | ICD-10-CM

## 2023-10-24 NOTE — Therapy (Signed)
 OUTPATIENT PHYSICAL THERAPY LOWER EXTREMITY TREATMENT   Patient Name: Monica Mcdaniel MRN: [id number] DOB:1971-02-03, 53 y.o., female Today's Date: 10/24/2023  END OF SESSION:  PT End of Session - 10/24/23 0815     Visit Number 5    Date for PT Re-Evaluation 11/29/23    Authorization Type 5 PT visits from 10/10/2023 - 12/08/2023.    Authorization - Visit Number 5    Authorization - Number of Visits 5    PT Start Time 0800    PT Stop Time 0845    PT Time Calculation (min) 45 min    Activity Tolerance Patient tolerated treatment well         Past Medical History:  Diagnosis Date   Anxiety    GERD (gastroesophageal reflux disease)    Heart murmur    functional heart murmur--as a child   Hypertension    Iron deficiency anemia 07/22/2019   Past Surgical History:  Procedure Laterality Date   CHOLECYSTECTOMY     KNEE SURGERY Left    removal of benign tumor   REFRACTIVE SURGERY     TUBAL LIGATION     Patient Active Problem List   Diagnosis Date Noted   Chronic left hip pain 09/19/2023   Right lateral epicondylitis 03/02/2023   Type 2 diabetes mellitus with hyperglycemia, without long-term current use of insulin (HCC) 05/26/2022   Morbid obesity (HCC) 11/24/2021   Iron deficiency anemia 07/22/2019   Gastroesophageal reflux disease 05/01/2018   Persistent cough 05/01/2018   Hyperglycemia 11/08/2017   Hypertension 10/10/2017    PCP: Benton LITTIE Gave, PA  REFERRING PROVIDER: Benton LITTIE Gave, PA  REFERRING DIAG: Tear of L acetabular labrum   THERAPY DIAG:  Pain in left hip  Other symptoms and signs involving the musculoskeletal system  Muscle weakness (generalized)  Rationale for Evaluation and Treatment: Rehabilitation  ONSET DATE: 07/16/23; chronic pain ~ 2 years   SUBJECTIVE:   SUBJECTIVE STATEMENT: Patient reports she was improving until the weekend when she walked a lot of waking [place] the zoo. Pain is in the L gluts/piriformis.  She still has the constant dull  ache in hip joint.    Eval: Patient reports that she has had L hip pain over the past two years. She feels she had a groin pull which was treated with steroid dowe pack. She has has stopped walking for her health and doing lower body workouts and symptoms have improved. L hip pain described as a deep ache for the past 1-2 months. MRI 09/26/23 showed tear of anterior acetabular labrum (see results below). Has noticed some LBP the past couple of years which seems related to hip pain. Patient reports improvement in hip pain with initial exercises today.   PERTINENT HISTORY: HTN; AODM controlled with diet and exercise; acid reflux; tendinitis of elbow  PAIN:  Are you having pain? Yes: NPRS scale:6/10 Pain location: deep back of L hip  Pain description: nagging; dull; constant Aggravating factors: walking - pain the following day; standing > min; sitting >5-10 min  Relieving factors: movement; changing positions   PRECAUTIONS: None   WEIGHT BEARING RESTRICTIONS: No  FALLS:  Has patient fallen in last 6 months? No  LIVING ENVIRONMENT: Lives with: lives with their spouse Lives in: House/apartment Stairs: Yes: Internal: 12-14 steps; on left going up and External: 4-5 steps; can reach both Has following equipment at home: None  OCCUPATION: works at [employer] - does a lot of driving    PATIENT GOALS: get  back to walking for exercise; improve stability of L hip   NEXT MD VISIT: 02/20/23  OBJECTIVE:  Note: Objective measures were completed at Evaluation unless otherwise noted.  DIAGNOSTIC FINDINGS: MR L hip 09/27/23: Bones: No acute fracture. No dislocation. No femoral head avascular necrosis. Bony pelvis intact without diastasis. SI joints and pubic symphysis within normal limits. No bone marrow edema. No marrow replacing bone lesion.   Articular cartilage and labrum   Articular cartilage: Mild chondral thinning along the superior aspect of the left hip joint without focal  cartilage defect. No subchondral marrow signal changes.   Labrum: Superior labral tear (series 11, images 8-10). No paralabral cyst.   Joint or bursal effusion   Joint effusion:  Well distended with injected contrast.   Bursae: No abnormal bursal fluid collection.   Muscles and tendons   Muscles and tendons: Mild tendinosis of the bilateral gluteus minimus tendons. The hamstring, iliopsoas, rectus femoris, and adductor tendons appear intact without tear or significant tendinosis. Normal muscle bulk and signal intensity without edema, atrophy, or fatty infiltration.   Other findings   Miscellaneous: No soft tissue edema or fluid collection. No inguinal lymphadenopathy.   IMPRESSION: 1. Mild left hip osteoarthritis with superior labral tear. 2. Mild tendinosis of the bilateral gluteus minimus tendons.  PATIENT SURVEYS:  PSFS: THE PATIENT SPECIFIC FUNCTIONAL SCALE  Place score of 0-10 (0 = unable to perform activity and 10 = able to perform activity at the same level as before injury or problem)  Activity Date: 10/04/23    Walking for exercise  0    2.Standing > 5-10 min  5    3.Lower body workout in gym 0    4. Stand on L LE (SLS) 0    Total Score 5      Total Score = Sum of activity scores/number of activities 5/4 = 1.25  Minimally Detectable Change: 3 points (for single activity); 2 points (for average score)  [facility] (nd). The Patient Specific Functional Scale . Retrieved from [email or web address]     SENSATION: Tingling in L hip intermittently daily  Occasional numbness in the back of the leg to foot maybe 2x/wk  EDEMA:  None   MUSCLE LENGTH: Hamstrings: Right 70 deg; Left 65 deg Thomas test: Right -5 deg; Left -10 deg  POSTURE: rounded shoulders, forward head, and higher L than R hemipelvis, PSIS   PALPATION: Tenderness L iliacus; psoas; piriformis; gluts; QL; lats; lumbar  paraspinals  LOWER EXTREMITY ROM: WFL's except L ER in supine ER ROM better with patient prone  Active ROM Right eval Left eval  Hip flexion    Hip extension    Hip abduction    Hip adduction    Hip internal rotation    Hip external rotation  Tight; pain  Knee flexion    Knee extension    Ankle dorsiflexion    Ankle plantarflexion    Ankle inversion    Ankle eversion     (Blank rows = not tested)  LOWER EXTREMITY MMT:  MMT Right eval Left eval  Hip flexion    Hip extension  4  Hip abduction  4+  Hip adduction    Hip internal rotation    Hip external rotation    Knee flexion    Knee extension    Ankle dorsiflexion    Ankle plantarflexion    Ankle inversion    Ankle eversion     (Blank rows = not tested)  LOWER EXTREMITY SPECIAL  TESTS:  Hip special tests: Belvie (FABER) test: positive , Thomas test: positive for tightness and discomfort on L , and Piriformis test: positive for tightness and discomfort L   FUNCTIONAL TESTS:  5 times sit to stand: 9.33 sec no use of UE's  SLS R 10 sec; L 6 sec more difficulty with balance   GAIT: Distance walked: 40 ft Assistive device utilized: None Level of assistance: Complete Independence Comments: WFL's   [facility] Adult PT Treatment:                                                DATE: 10/24/23 Therapeutic Exercise: Standing hip extension 2-3 sec  Hip flexor stretch  Piriformis stretch supine travell 30 sec x 2  Manual Therapy: TPR L > R iliacus Deep tissue work L anterior hip STM L gluts with IR/ER L hip knee flexed 90 deg hip extended  Neuromuscular re-ed: Pelvic realignment supine and standing 2 sec x 10  Therapeutic Activity: Hip flexor stretch supine thomas stretch 30 sec x 3  Piriformis stretch travel 30 sec x 3 L > R ITB stretch sidelying PT assist 30 sec x 3 L  Clamshell x 20 R/L  Self Care: Myofacial ball release work hip flexor prone; gluts supine using 4 inch plastic ball    [facility] Adult PT Treatment:                                                 DATE: 10/18/23 Therapeutic Exercise: Standing hip extension 2-3 sec  Hip flexor stretch  Manual Therapy: TPR L > R iliacus Deep tissue work L anterior hip STM L gluts with IR/ER L hip knee flexed 90 deg hip extended  Neuromuscular re-ed:  Therapeutic Activity: Hip flexor stretch supine thomas stretch 30 sec x 3  Hip flexor stretch sitting lower surface 30 sec x 2  Piriformis stretch travel 30 sec x 3 L > R ITB stretch sidelying PT assist 30 sec x 3 L  Self Care: Myofacial ball release work hip flexor prone; gluts supine using 4 inch plastic ball    [facility] Adult PT Treatment:                                                DATE: 10/12/2023 Therapeutic Exercise: Figure 4 stretch  Supine piriformis stretch  Self-massage/TPR with green spiky ball -- piriformis Neuromuscular re-ed: Hooklying hip add isometric + ball squeeze x10 Hip abd isometric + blue TB x5 Bridges + black TB x10 --> staggered stance x10 Prone hip extension straight leg x10  Prone bent knee hip extension x10  Bent over on counter + hip extension on diagonal  Glute med press against block on wall (bil) Therapeutic Activity: Prone press up x10 Prone prop on elbows + knee flexion x10 (Lt) Modified single leg dead lift (Lt)    PATIENT EDUCATION:  Education details: Updated HEP  Person educated: Patient Education method: Explanation, Demonstration, Tactile cues, Verbal cues, and Handouts Education comprehension: verbalized understanding, returned demonstration, verbal cues required, tactile cues required, and needs further education  HOME EXERCISE  PROGRAM: Access Code: [email or web address] URL: [email or web address] Date: 10/24/2023 Prepared by: Arietta Eisenstein  Exercises - Prone Press Up  - 2 x daily - 7 x weekly - 1 sets - 10 reps - 2-3 sec  hold - Prone Press Up On Elbows  - 2 x daily - 7 x weekly - 1 sets - 3 reps - 30 sec  hold - Standing Lumbar Extension  - 2 x daily  - 7 x weekly - 1 sets - 2-3 reps - 2-3 sec  hold - Supine Piriformis Stretch with Leg Straight  - 2 x daily - 7 x weekly - 1 sets - 3 reps - 30 sec  hold - Hip Flexor Stretch at Edge of Bed  - 2 x daily - 7 x weekly - 1 sets - 3 reps - 30 sec  hold - Hooklying Hamstring Stretch with Strap  - 2 x daily - 7 x weekly - 1 sets - 3 reps - 30 sec  hold - Standing Piriformis Release with Ball at [employer]  - 2 x daily - 7 x weekly - 30-60 sec  hold - Sidelying ITB Stretch  - 2 x daily - 7 x weekly - 1 sets - 3 reps - 30 sec  hold - Seated Hip Flexor Stretch  - 2 x daily - 7 x weekly - 1 sets - 3 reps - 30 sec  hold - Prone Quadriceps Stretch with Strap  - 2 x daily - 7 x weekly - 1 sets - 3 reps - 30 sec  hold - Isometric Gluteus Medius at Wall  - 1 x daily - 7 x weekly - 1-2 sets - 10 reps - 5 sec hold - Sidelying Hip Abduction  - 1 x daily - 7 x weekly - 3 sets - 10 reps - 3-5 sec  hold - Supine Bridge with Resistance Band  - 2 x daily - 7 x weekly - 1-2 sets - 10 reps - 5-10 sec  hold - Anti-Rotation Lateral Stepping with Press  - 2 x daily - 7 x weekly - 1-2 sets - 10 reps - 2-3 sec  hold - Clamshell  - 2 x daily - 7 x weekly - 1 sets - 3 reps - 30 sec  hold - Supine Figure 4 Piriformis Stretch  - 1 x daily - 7 x weekly - 1 sets - 1 reps - 3-5 min  hold  Patient Education - Office Posture  ASSESSMENT:  CLINICAL IMPRESSION: Progressing well with decreasing pain until the weekend when she stood and walked [place] hours [place] the zoo in addition for a lot of sitting with travel. Continued with manual techniques to release tight iliacus and psoas as well as piriformis/gluts. Added facilitation of glut med/max. Decreased pain in L hip area. Continued tightness in the R > L iliacus.  Will continue with myofacial release through the iliacus, psoas and posterior hip. Patient reports resolution of constant ache with treatment.   Eval: Patient is a 53 y.o. female who was seen today for physical therapy  evaluation and treatment for torn L acetabular labrum. She presents with history of L hip pain for the past two years with increased pain in the past two months. Patient has asymmetrical posture with L hemipelvis higher than R in standing; tightness in ROM L ER; weakness L hip compared to R; muscular tightness as noted above; pain with functional activities. Patient will benefit from skilled PT to address the problems identified.  OBJECTIVE IMPAIRMENTS: decreased activity tolerance, decreased ROM, decreased strength, impaired flexibility, improper body mechanics, postural dysfunction, and pain.    GOALS: Goals reviewed with patient? Yes  SHORT TERM GOALS: Target date: 11/01/2023  Independent in initial HEP  Baseline: Goal status: INITIAL  2.  Increase mobility with decreased pain in L hip rotation and extension allowing patient to perform exercises without difficulty  Baseline:  Goal status: INITIAL  3.  Increase strength L hip abduction and extension to 5/5  Baseline:  Goal status: INITIAL   LONG TERM GOALS: Target date: 11/29/2023  Patient reports decrease in L hip pain by 75-90% allowing her to perform 30-40 min of walking or exercise without increase in symptoms  Baseline:  Goal status: INITIAL  2.  Patient reports ability to sit for 1-2 hours without increase in pain when she stands up from sitting  Baseline:  Goal status: INITIAL  3.  Patient reports ability to stand and walk for 30-45 min without increase in pain from baseline  Baseline:  Goal status: INITIAL  4.  Patient demonstrates ability to stand on L LE for 10 + sec without loss of balance  Baseline:  Goal status: INITIAL  5.  IMPROVE PSFS: THE PATIENT SPECIFIC FUNCTIONAL SCALE SCORE BY 2 POINTS  Place score of 0-10 (0 = unable to perform activity and 10 = able to perform activity at the same level as before injury or problem)  Activity Date: 10/04/23    Walking for exercise  0    2.Standing > 5-10 min  5     3.Lower body workout in gym 0    4. Stand on L LE (SLS) 0    Total Score 5      Total Score = Sum of activity scores/number of activities 5/4 = 1.25  Minimally Detectable Change: 3 points (for single activity); 2 points (for average score) Baseline:  Goal status: INITIAL  6.  Independent in advanced HEP including aquatic program as indicated  Baseline:  Goal status: INITIAL   PLAN:  PT FREQUENCY: 2x/week  PT DURATION: 8 weeks  PLANNED INTERVENTIONS: 97164- PT Re-evaluation, 97110-Therapeutic exercises, 97530- Therapeutic activity, 97112- Neuromuscular re-education, 97535- Self Care, [id number]- Manual therapy, [phone]- Gait training, [phone]- Aquatic Therapy, Patient/Family education, Balance training, Stair training, Taping, and Joint mobilization  PLAN FOR NEXT SESSION: review and progress exercises; education and ergonomic recommendations; manual work and modalities as indicated  Iliacus release    Iokepa Geffre SHAUNNA Baptist, PT 10/24/2023, 8:16 AM

## 2023-10-26 ENCOUNTER — Ambulatory Visit: Payer: Self-pay | Admitting: Obstetrics and Gynecology

## 2023-10-26 DIAGNOSIS — N950 Postmenopausal bleeding: Secondary | ICD-10-CM

## 2023-10-27 ENCOUNTER — Ambulatory Visit: Admitting: Rehabilitative and Restorative Service Providers"

## 2023-10-30 ENCOUNTER — Ambulatory Visit: Admitting: Rehabilitative and Restorative Service Providers"

## 2023-10-30 ENCOUNTER — Encounter: Payer: Self-pay | Admitting: Rehabilitative and Restorative Service Providers"

## 2023-10-30 DIAGNOSIS — R29898 Other symptoms and signs involving the musculoskeletal system: Secondary | ICD-10-CM

## 2023-10-30 DIAGNOSIS — M6281 Muscle weakness (generalized): Secondary | ICD-10-CM

## 2023-10-30 DIAGNOSIS — M25552 Pain in left hip: Secondary | ICD-10-CM

## 2023-10-30 NOTE — Therapy (Signed)
 OUTPATIENT PHYSICAL THERAPY LOWER EXTREMITY TREATMENT   Patient Name: Monica Mcdaniel MRN: [id number] DOB:[birth year], 53 y.o., female Today's Date: 10/30/2023  END OF SESSION:  PT End of Session - 10/30/23 0712     Visit Number 6    Number of Visits 16    Date for PT Re-Evaluation 11/29/23    Authorization Type 5 PT visits from 10/10/2023 - 12/08/2023.    PT Start Time 0715    PT Stop Time 0800    PT Time Calculation (min) 45 min         Past Medical History:  Diagnosis Date   Anxiety    GERD (gastroesophageal reflux disease)    Heart murmur    functional heart murmur--as a child   Hypertension    Iron deficiency anemia 07/22/2019   Past Surgical History:  Procedure Laterality Date   CHOLECYSTECTOMY     KNEE SURGERY Left    removal of benign tumor   REFRACTIVE SURGERY     TUBAL LIGATION     Patient Active Problem List   Diagnosis Date Noted   Chronic left hip pain 09/19/2023   Right lateral epicondylitis 03/02/2023   Type 2 diabetes mellitus with hyperglycemia, without long-term current use of insulin (HCC) 05/26/2022   Morbid obesity (HCC) 11/24/2021   Iron deficiency anemia 07/22/2019   Gastroesophageal reflux disease 05/01/2018   Persistent cough 05/01/2018   Hyperglycemia 11/08/2017   Hypertension 10/10/2017    PCP: Benton LITTIE Gave, PA  REFERRING PROVIDER: Benton LITTIE Gave, PA  REFERRING DIAG: Tear of L acetabular labrum   THERAPY DIAG:  Pain in left hip  Other symptoms and signs involving the musculoskeletal system  Muscle weakness (generalized)  Rationale for Evaluation and Treatment: Rehabilitation  ONSET DATE: 07/16/23; chronic pain ~ 2 years   SUBJECTIVE:   SUBJECTIVE STATEMENT: Patient reports she was improving with less deep ache and pain is no longer awakening her at night. Ache is in the L gluts/piriformis but is less intense.  She still has the constant dull ache in posterior hip - no longer in the joint.    Eval: Patient reports that she  has had L hip pain over the past two years. She feels she had a groin pull which was treated with steroid dowe pack. She has has stopped walking for her health and doing lower body workouts and symptoms have improved. L hip pain described as a deep ache for the past 1-2 months. MRI 09/26/23 showed tear of anterior acetabular labrum (see results below). Has noticed some LBP the past couple of years which seems related to hip pain. Patient reports improvement in hip pain with initial exercises today.   PERTINENT HISTORY: HTN; AODM controlled with diet and exercise; acid reflux; tendinitis of elbow  PAIN:  Are you having pain? Yes: NPRS scale: 3/10 Pain location: deep back of L hip  Pain description: nagging; dull; constant Aggravating factors: walking - pain the following day; standing > min; sitting >5-10 min  Relieving factors: movement; changing positions   PRECAUTIONS: None   WEIGHT BEARING RESTRICTIONS: No  FALLS:  Has patient fallen in last 6 months? No  LIVING ENVIRONMENT: Lives with: lives with their spouse Lives in: House/apartment Stairs: Yes: Internal: 12-14 steps; on left going up and External: 4-5 steps; can reach both Has following equipment at home: None  OCCUPATION: works at [employer] - does a lot of driving    PATIENT GOALS: get back to walking for exercise; improve stability of  L hip   NEXT MD VISIT: 02/20/23  OBJECTIVE:  Note: Objective measures were completed at Evaluation unless otherwise noted.  DIAGNOSTIC FINDINGS: MR L hip 09/27/23: Bones: No acute fracture. No dislocation. No femoral head avascular necrosis. Bony pelvis intact without diastasis. SI joints and pubic symphysis within normal limits. No bone marrow edema. No marrow replacing bone lesion.   Articular cartilage and labrum   Articular cartilage: Mild chondral thinning along the superior aspect of the left hip joint without focal cartilage defect. No subchondral marrow signal  changes.   Labrum: Superior labral tear (series 11, images 8-10). No paralabral cyst.   Joint or bursal effusion   Joint effusion:  Well distended with injected contrast.   Bursae: No abnormal bursal fluid collection.   Muscles and tendons   Muscles and tendons: Mild tendinosis of the bilateral gluteus minimus tendons. The hamstring, iliopsoas, rectus femoris, and adductor tendons appear intact without tear or significant tendinosis. Normal muscle bulk and signal intensity without edema, atrophy, or fatty infiltration.   Other findings   Miscellaneous: No soft tissue edema or fluid collection. No inguinal lymphadenopathy.   IMPRESSION: 1. Mild left hip osteoarthritis with superior labral tear. 2. Mild tendinosis of the bilateral gluteus minimus tendons.  PATIENT SURVEYS:  PSFS: THE PATIENT SPECIFIC FUNCTIONAL SCALE  Place score of 0-10 (0 = unable to perform activity and 10 = able to perform activity at the same level as before injury or problem)  Activity Date: 10/04/23    Walking for exercise  0    2.Standing > 5-10 min  5    3.Lower body workout in gym 0    4. Stand on L LE (SLS) 0    Total Score 5      Total Score = Sum of activity scores/number of activities 5/4 = 1.25  Minimally Detectable Change: 3 points (for single activity); 2 points (for average score)  [facility] (nd). The Patient Specific Functional Scale . Retrieved from [email or web address]     SENSATION: Tingling in L hip intermittently daily  Occasional numbness in the back of the leg to foot maybe 2x/wk  EDEMA:  None   MUSCLE LENGTH: Hamstrings: Right 70 deg; Left 65 deg Thomas test: Right -5 deg; Left -10 deg  POSTURE: rounded shoulders, forward head, and higher L than R hemipelvis, PSIS   PALPATION: Tenderness L iliacus; psoas; piriformis; gluts; QL; lats; lumbar paraspinals  LOWER EXTREMITY ROM: WFL's except L ER in  supine ER ROM better with patient prone  Active ROM Right eval Left eval  Hip flexion    Hip extension    Hip abduction    Hip adduction    Hip internal rotation    Hip external rotation  Tight; pain  Knee flexion    Knee extension    Ankle dorsiflexion    Ankle plantarflexion    Ankle inversion    Ankle eversion     (Blank rows = not tested)  LOWER EXTREMITY MMT:  MMT Right eval Left eval  Hip flexion    Hip extension  4  Hip abduction  4+  Hip adduction    Hip internal rotation    Hip external rotation    Knee flexion    Knee extension    Ankle dorsiflexion    Ankle plantarflexion    Ankle inversion    Ankle eversion     (Blank rows = not tested)  LOWER EXTREMITY SPECIAL TESTS:  Hip special tests: Belvie (FABER) test:  positive , Thomas test: positive for tightness and discomfort on L , and Piriformis test: positive for tightness and discomfort L   FUNCTIONAL TESTS:  5 times sit to stand: 9.33 sec no use of UE's  SLS R 10 sec; L 6 sec more difficulty with balance   GAIT: Distance walked: 40 ft Assistive device utilized: None Level of assistance: Complete Independence Comments: WFL's   [facility] Adult PT Treatment:                                                DATE: 10/30/23 Therapeutic Exercise: Standing hip extension 2-3 sec  Hip flexor stretch  Piriformis stretch supine travell 30 sec x 2  Manual Therapy: TPR L > R iliacus Deep tissue work L anterior hip STM L gluts with IR/ER L hip knee flexed 90 deg hip extended  Neuromuscular re-ed: Pelvic realignment supine and standing 2 sec x 10  Therapeutic Activity: Hip flexor stretch supine thomas stretch 30 sec x 3  Piriformis stretch travel 30 sec x 3 L > R ITB stretch sidelying PT assist 30 sec x 3 L  Clamshell sidelying yellow TB x 20 R/L  Bridge with yellow TB 5 sec x 10 x 2  Side steps red TB x 10 x 10  Hip IR red at ankles ball btn knees  Self Care: Myofacial ball release work hip flexor prone;  gluts supine using 4 inch plastic ball    [facility] Adult PT Treatment:                                                DATE: 10/24/23 Therapeutic Exercise: Standing hip extension 2-3 sec  Hip flexor stretch  Piriformis stretch supine travell 30 sec x 2  Manual Therapy: TPR L > R iliacus Deep tissue work L anterior hip STM L gluts with IR/ER L hip knee flexed 90 deg hip extended  Neuromuscular re-ed: Pelvic realignment supine and standing 2 sec x 10  Therapeutic Activity: Hip flexor stretch supine thomas stretch 30 sec x 3  Piriformis stretch travel 30 sec x 3 L > R ITB stretch sidelying PT assist 30 sec x 3 L  Clamshell x 20 R/L  Self Care: Myofacial ball release work hip flexor prone; gluts supine using 4 inch plastic ball    [facility] Adult PT Treatment:                                                DATE: 10/18/23 Therapeutic Exercise: Standing hip extension 2-3 sec  Hip flexor stretch  Manual Therapy: TPR L > R iliacus Deep tissue work L anterior hip STM L gluts with IR/ER L hip knee flexed 90 deg hip extended  Neuromuscular re-ed:  Therapeutic Activity: Hip flexor stretch supine thomas stretch 30 sec x 3  Hip flexor stretch sitting lower surface 30 sec x 2  Piriformis stretch travel 30 sec x 3 L > R ITB stretch sidelying PT assist 30 sec x 3 L  Self Care: Myofacial ball release work hip flexor prone; gluts supine using 4 inch plastic ball  PATIENT EDUCATION:  Education details: Updated HEP  Person educated: Patient Education method: Explanation, Demonstration, Tactile cues, Verbal cues, and Handouts Education comprehension: verbalized understanding, returned demonstration, verbal cues required, tactile cues required, and needs further education  HOME EXERCISE PROGRAM: Access Code: [email or web address] URL: [email or web address] Date: 10/30/2023 Prepared by: Kasten Leveque  Exercises - Prone Press Up  - 2 x daily - 7 x weekly - 1 sets - 10 reps - 2-3 sec  hold - Prone  Press Up On Elbows  - 2 x daily - 7 x weekly - 1 sets - 3 reps - 30 sec  hold - Standing Lumbar Extension  - 2 x daily - 7 x weekly - 1 sets - 2-3 reps - 2-3 sec  hold - Supine Piriformis Stretch with Leg Straight  - 2 x daily - 7 x weekly - 1 sets - 3 reps - 30 sec  hold - Hip Flexor Stretch at Edge of Bed  - 2 x daily - 7 x weekly - 1 sets - 3 reps - 30 sec  hold - Hooklying Hamstring Stretch with Strap  - 2 x daily - 7 x weekly - 1 sets - 3 reps - 30 sec  hold - Standing Piriformis Release with Ball at [employer]  - 2 x daily - 7 x weekly - 30-60 sec  hold - Sidelying ITB Stretch  - 2 x daily - 7 x weekly - 1 sets - 3 reps - 30 sec  hold - Seated Hip Flexor Stretch  - 2 x daily - 7 x weekly - 1 sets - 3 reps - 30 sec  hold - Prone Quadriceps Stretch with Strap  - 2 x daily - 7 x weekly - 1 sets - 3 reps - 30 sec  hold - Isometric Gluteus Medius at Wall  - 1 x daily - 7 x weekly - 1-2 sets - 10 reps - 5 sec hold - Sidelying Hip Abduction  - 1 x daily - 7 x weekly - 3 sets - 10 reps - 3-5 sec  hold - Supine Bridge with Resistance Band  - 2 x daily - 7 x weekly - 1-2 sets - 10 reps - 5-10 sec  hold - Anti-Rotation Lateral Stepping with Press  - 2 x daily - 7 x weekly - 1-2 sets - 10 reps - 2-3 sec  hold - Supine Figure 4 Piriformis Stretch  - 1 x daily - 7 x weekly - 1 sets - 1 reps - 3-5 min  hold - Clamshell with Resistance  - 1 x daily - 7 x weekly - 1 sets - 10 reps - 3 sec  hold - Seated Hip Internal Rotation with Ball and Resistance  - 2 x daily - 7 x weekly - 1 sets - 10 reps - 3 sec  hold - Side Stepping with Resistance at Thighs  - 1 x daily - 7 x weekly  Patient Education - Office Posture  ASSESSMENT:  CLINICAL IMPRESSION: Continued progress with decreasing pain and aching in the L hip. She has increased her activity and was able to walk yesterday without increasing pain. Continued with manual techniques to release tight iliacus and psoas as well as piriformis/gluts. Progressed with  strengthening of glut med/max. Decreased tightness noted in the R > L iliacus. Will continue with myofacial release through the iliacus, psoas and posterior hip.   Eval: Patient is a 53 y.o. female who was seen today for physical therapy evaluation and treatment  for torn L acetabular labrum. She presents with history of L hip pain for the past two years with increased pain in the past two months. Patient has asymmetrical posture with L hemipelvis higher than R in standing; tightness in ROM L ER; weakness L hip compared to R; muscular tightness as noted above; pain with functional activities. Patient will benefit from skilled PT to address the problems identified.    OBJECTIVE IMPAIRMENTS: decreased activity tolerance, decreased ROM, decreased strength, impaired flexibility, improper body mechanics, postural dysfunction, and pain.    GOALS: Goals reviewed with patient? Yes  SHORT TERM GOALS: Target date: 11/01/2023  Independent in initial HEP  Baseline: Goal status: INITIAL  2.  Increase mobility with decreased pain in L hip rotation and extension allowing patient to perform exercises without difficulty  Baseline:  Goal status: INITIAL  3.  Increase strength L hip abduction and extension to 5/5  Baseline:  Goal status: INITIAL   LONG TERM GOALS: Target date: 11/29/2023  Patient reports decrease in L hip pain by 75-90% allowing her to perform 30-40 min of walking or exercise without increase in symptoms  Baseline:  Goal status: INITIAL  2.  Patient reports ability to sit for 1-2 hours without increase in pain when she stands up from sitting  Baseline:  Goal status: INITIAL  3.  Patient reports ability to stand and walk for 30-45 min without increase in pain from baseline  Baseline:  Goal status: INITIAL  4.  Patient demonstrates ability to stand on L LE for 10 + sec without loss of balance  Baseline:  Goal status: INITIAL  5.  IMPROVE PSFS: THE PATIENT SPECIFIC FUNCTIONAL  SCALE SCORE BY 2 POINTS  Place score of 0-10 (0 = unable to perform activity and 10 = able to perform activity at the same level as before injury or problem)  Activity Date: 10/04/23    Walking for exercise  0    2.Standing > 5-10 min  5    3.Lower body workout in gym 0    4. Stand on L LE (SLS) 0    Total Score 5      Total Score = Sum of activity scores/number of activities 5/4 = 1.25  Minimally Detectable Change: 3 points (for single activity); 2 points (for average score) Baseline:  Goal status: INITIAL  6.  Independent in advanced HEP including aquatic program as indicated  Baseline:  Goal status: INITIAL   PLAN:  PT FREQUENCY: 2x/week  PT DURATION: 8 weeks  PLANNED INTERVENTIONS: 97164- PT Re-evaluation, 97110-Therapeutic exercises, 97530- Therapeutic activity, 97112- Neuromuscular re-education, 97535- Self Care, [id number]- Manual therapy, [phone]- Gait training, [phone]- Aquatic Therapy, Patient/Family education, Balance training, Stair training, Taping, and Joint mobilization  PLAN FOR NEXT SESSION: review and progress exercises; education and ergonomic recommendations; manual work and modalities as indicated  Iliacus release    Nathanyel Defenbaugh SHAUNNA Baptist, PT 10/30/2023, 7:12 AM

## 2023-11-06 ENCOUNTER — Ambulatory Visit

## 2023-11-08 ENCOUNTER — Ambulatory Visit: Admitting: Rehabilitative and Restorative Service Providers"

## 2023-11-08 ENCOUNTER — Encounter: Payer: Self-pay | Admitting: Rehabilitative and Restorative Service Providers"

## 2023-11-08 DIAGNOSIS — M6281 Muscle weakness (generalized): Secondary | ICD-10-CM | POA: Diagnosis not present

## 2023-11-08 DIAGNOSIS — M25552 Pain in left hip: Secondary | ICD-10-CM

## 2023-11-08 DIAGNOSIS — R29898 Other symptoms and signs involving the musculoskeletal system: Secondary | ICD-10-CM

## 2023-11-08 NOTE — Therapy (Signed)
 OUTPATIENT PHYSICAL THERAPY LOWER EXTREMITY TREATMENT PHYSICAL THERAPY DISCHARGE SUMMARY  Visits from Start of Care: 7  Current functional level related to goals / functional outcomes: See progress note for discharge status    Remaining deficits: No deficits. Needs to continue with HEP    Education / Equipment: HEP    Patient agrees to discharge. Patient goals were met. Patient is being discharged due to meeting the stated rehab goals.  Tela Kotecki P. Ina PT, MPH 11/08/23 8:17 AM    Patient Name: Monica Mcdaniel MRN: [id number] DOB:28-Oct-1970, 53 y.o., female Today's Date: 11/08/2023  END OF SESSION:  PT End of Session - 11/08/23 0715     Visit Number 7    Number of Visits 16    Date for Recertification  11/29/23    Authorization Type 5 PT visits from 10/10/2023 - 12/08/2023 4 additional visits approved 10/27/23    Authorization - Visit Number 7    Authorization - Number of Visits 9    PT Start Time 0715    PT Stop Time 0800    PT Time Calculation (min) 45 min         Past Medical History:  Diagnosis Date   Anxiety    GERD (gastroesophageal reflux disease)    Heart murmur    functional heart murmur--as a child   Hypertension    Iron deficiency anemia 07/22/2019   Past Surgical History:  Procedure Laterality Date   CHOLECYSTECTOMY     KNEE SURGERY Left    removal of benign tumor   REFRACTIVE SURGERY     TUBAL LIGATION     Patient Active Problem List   Diagnosis Date Noted   Chronic left hip pain 09/19/2023   Right lateral epicondylitis 03/02/2023   Type 2 diabetes mellitus with hyperglycemia, without long-term current use of insulin (HCC) 05/26/2022   Morbid obesity (HCC) 11/24/2021   Iron deficiency anemia 07/22/2019   Gastroesophageal reflux disease 05/01/2018   Persistent cough 05/01/2018   Hyperglycemia 11/08/2017   Hypertension 10/10/2017    PCP: Benton LITTIE Gave, PA  REFERRING PROVIDER: Benton LITTIE Gave, PA  REFERRING DIAG: Tear of L acetabular labrum    THERAPY DIAG:  Pain in left hip  Other symptoms and signs involving the musculoskeletal system  Muscle weakness (generalized)  Rationale for Evaluation and Treatment: Rehabilitation  ONSET DATE: 07/16/23; chronic pain ~ 2 years   SUBJECTIVE:   SUBJECTIVE STATEMENT: Patient reports she is doing well. She has minimal pain when she is standing over on the L side with most of her weight. She has returned to normal activities including exercise without difficulty. She has improving with no deep ache and pain is no longer awakening her at night. She has no ache is in the L gluts/piriformis; no constant dull ache in posterior hip or joint pain. Feels really good and has not felt good in a long time. Feels confident in continuing with independent HEP    Eval: Patient reports that she has had L hip pain over the past two years. She feels she had a groin pull which was treated with steroid dose pack. She has has stopped walking for her health and doing lower body workouts and symptoms have improved. L hip pain described as a deep ache for the past 1-2 months. MRI 09/26/23 showed tear of anterior acetabular labrum (see results below). Has noticed some LBP the past couple of years which seems related to hip pain. Patient reports improvement in hip pain with initial exercises  today.   PERTINENT HISTORY: HTN; AODM controlled with diet and exercise; acid reflux; tendinitis of elbow  PAIN:  Are you having pain? Yes: NPRS scale: 0/10 Pain location: deep back of L hip  Pain description: nagging; dull; constant Aggravating factors: walking - pain the following day; standing > min; sitting >5-10 min  Relieving factors: movement; changing positions   PRECAUTIONS: None   WEIGHT BEARING RESTRICTIONS: No  FALLS:  Has patient fallen in last 6 months? No  LIVING ENVIRONMENT: Lives with: lives with their spouse Lives in: House/apartment Stairs: Yes: Internal: 12-14 steps; on left going up and External:  4-5 steps; can reach both Has following equipment at home: None  OCCUPATION: works at [employer] - does a lot of driving    PATIENT GOALS: get back to walking for exercise; improve stability of L hip   NEXT MD VISIT: 02/20/23  OBJECTIVE:  Note: Objective measures were completed at Evaluation unless otherwise noted.  DIAGNOSTIC FINDINGS: MR L hip 09/27/23: Bones: No acute fracture. No dislocation. No femoral head avascular necrosis. Bony pelvis intact without diastasis. SI joints and pubic symphysis within normal limits. No bone marrow edema. No marrow replacing bone lesion.   Articular cartilage and labrum   Articular cartilage: Mild chondral thinning along the superior aspect of the left hip joint without focal cartilage defect. No subchondral marrow signal changes.   Labrum: Superior labral tear (series 11, images 8-10). No paralabral cyst.   Joint or bursal effusion   Joint effusion:  Well distended with injected contrast.   Bursae: No abnormal bursal fluid collection.   Muscles and tendons   Muscles and tendons: Mild tendinosis of the bilateral gluteus minimus tendons. The hamstring, iliopsoas, rectus femoris, and adductor tendons appear intact without tear or significant tendinosis. Normal muscle bulk and signal intensity without edema, atrophy, or fatty infiltration.   Other findings   Miscellaneous: No soft tissue edema or fluid collection. No inguinal lymphadenopathy.   IMPRESSION: 1. Mild left hip osteoarthritis with superior labral tear. 2. Mild tendinosis of the bilateral gluteus minimus tendons.  PATIENT SURVEYS:  PSFS: THE PATIENT SPECIFIC FUNCTIONAL SCALE  Place score of 0-10 (0 = unable to perform activity and 10 = able to perform activity at the same level as before injury or problem)  Activity Date: 10/04/23 11/08/23   Walking for exercise  0 10   2.Standing > 5-10 min  5 10   3.Lower body workout in gym 0 10   4. Stand on L LE (SLS) 0  10   Total Score 5 40     Total Score = Sum of activity scores/number of activities 5/4 = 1.25  11/08/23: 40/4 = 10   Minimally Detectable Change: 3 points (for single activity); 2 points (for average score)  [facility] (nd). The Patient Specific Functional Scale . Retrieved from [email or web address]     SENSATION: Tingling in L hip intermittently daily  Occasional numbness in the back of the leg to foot maybe 2x/wk  11/08/23: no c/o numbness or tingling   EDEMA:  None   MUSCLE LENGTH: Hamstrings: Right 70 deg; Left 65 deg Thomas test: Right -5 deg; Left -10 deg  11/08/23:  Debby test: neutral to +5 bilat   POSTURE: rounded shoulders, forward head, and higher L than R hemipelvis, PSIS   PALPATION: Tenderness L iliacus; psoas; piriformis; gluts; QL; lats; lumbar paraspinals  LOWER EXTREMITY ROM: WFL's except L ER in supine ER ROM better with patient prone  Active ROM Right eval Left eval 11/08/23  Hip flexion     Hip extension     Hip abduction     Hip adduction     Hip internal rotation     Hip external rotation  Tight; pain WNL no pain  Knee flexion     Knee extension     Ankle dorsiflexion     Ankle plantarflexion     Ankle inversion     Ankle eversion      (Blank rows = not tested)  LOWER EXTREMITY MMT:  MMT Right eval Left eval 11/08/23  Hip flexion     Hip extension  4 5  Hip abduction  4+ 5  Hip adduction     Hip internal rotation     Hip external rotation     Knee flexion     Knee extension     Ankle dorsiflexion     Ankle plantarflexion     Ankle inversion     Ankle eversion      (Blank rows = not tested)  LOWER EXTREMITY SPECIAL TESTS:  Hip special tests: Belvie (FABER) test: positive , Thomas test: positive for tightness and discomfort on L , and Piriformis test: positive for tightness and discomfort L   11/08/23: special tests WFL's   FUNCTIONAL TESTS:  5 times sit to  stand: 9.33 sec no use of UE's  SLS R 10 sec; L 6 sec more difficulty with balance   GAIT: Distance walked: 40 ft Assistive device utilized: None Level of assistance: Complete Independence Comments: WFL's   [facility] Adult PT Treatment:                                                DATE: 11/08/23 Therapeutic Exercise: Standing hip extension 2-3 sec  Hip flexor stretch  Piriformis stretch supine travell 30 sec x 2  Manual Therapy: TPR bilat iliacus Deep tissue work L anterior hip TPR obturator L gluts with IR/ER L hip knee flexed 90 deg hip extended  Neuromuscular re-ed: Pelvic realignment supine and standing 2 sec x 10  Anterior posterior pelvic tilt with and without dynadisc  Scap squeeze standing with noodle  Scap squeeze standing with noodle ER yellow TB W with noodle yellow TB  Therapeutic Activity: Pec stretch 3 positions 30 sec x 2  Hip flexor stretch supine thomas stretch 30 sec x 3  Piriformis stretch travel 30 sec x 3 L > R ITB stretch sidelying PT assist 30 sec x 3 L  Clamshell sidelying yellow TB x 20 R/L  Bridge with yellow TB 5 sec x 10 x 2  Side steps red TB x 10 x 10  Hip IR red at ankles ball btn knees  Self Care: Myofacial ball release work hip flexor prone; gluts supine; obturator sitting using 4 inch plastic ball    [facility] Adult PT Treatment:                                                DATE: 10/30/23 Therapeutic Exercise: Standing hip extension 2-3 sec  Hip flexor stretch  Piriformis stretch supine travell 30 sec x 2  Manual Therapy: TPR L > R iliacus Deep tissue work L anterior hip  STM L gluts with IR/ER L hip knee flexed 90 deg hip extended  Neuromuscular re-ed: Pelvic realignment supine and standing 2 sec x 10  Therapeutic Activity: Hip flexor stretch supine thomas stretch 30 sec x 3  Piriformis stretch travel 30 sec x 3 L > R ITB stretch sidelying PT assist 30 sec x 3 L  Clamshell sidelying yellow TB x 20 R/L  Bridge with yellow TB 5 sec x 10 x 2   Side steps red TB x 10 x 10  Hip IR red at ankles ball btn knees  Self Care: Myofacial ball release work hip flexor prone; gluts supine using 4 inch plastic ball    [facility] Adult PT Treatment:                                                DATE: 10/24/23 Therapeutic Exercise: Standing hip extension 2-3 sec  Hip flexor stretch  Piriformis stretch supine travell 30 sec x 2  Manual Therapy: TPR L > R iliacus Deep tissue work L anterior hip STM L gluts with IR/ER L hip knee flexed 90 deg hip extended  Neuromuscular re-ed: Pelvic realignment supine and standing 2 sec x 10  Therapeutic Activity: Hip flexor stretch supine thomas stretch 30 sec x 3  Piriformis stretch travel 30 sec x 3 L > R ITB stretch sidelying PT assist 30 sec x 3 L  Clamshell x 20 R/L  Self Care: Myofacial ball release work hip flexor prone; gluts supine using 4 inch plastic ball    [facility] Adult PT Treatment:                                                DATE: 10/18/23 Therapeutic Exercise: Standing hip extension 2-3 sec  Hip flexor stretch  Manual Therapy: TPR L > R iliacus Deep tissue work L anterior hip STM L gluts with IR/ER L hip knee flexed 90 deg hip extended  Neuromuscular re-ed:  Therapeutic Activity: Hip flexor stretch supine thomas stretch 30 sec x 3  Hip flexor stretch sitting lower surface 30 sec x 2  Piriformis stretch travel 30 sec x 3 L > R ITB stretch sidelying PT assist 30 sec x 3 L  Self Care: Myofacial ball release work hip flexor prone; gluts supine using 4 inch plastic ball     PATIENT EDUCATION:  Education details: Updated HEP  Person educated: Patient Education method: Programmer, multimedia, Demonstration, Actor cues, Verbal cues, and Handouts Education comprehension: verbalized understanding, returned demonstration, verbal cues required, tactile cues required, and needs further education  HOME EXERCISE PROGRAM: Access Code: [email or web address] URL: [email or web address] Date:  11/08/2023 Prepared by: Carollee Nussbaumer  Exercises - Prone Press Up  - 2 x daily - 7 x weekly - 1 sets - 10 reps - 2-3 sec  hold - Prone Press Up On Elbows  - 2 x daily - 7 x weekly - 1 sets - 3 reps - 30 sec  hold - Standing Lumbar Extension  - 2 x daily - 7 x weekly - 1 sets - 2-3 reps - 2-3 sec  hold - Supine Piriformis Stretch with Leg Straight  - 2 x daily - 7 x weekly - 1 sets - 3 reps -  30 sec  hold - Hip Flexor Stretch at Edge of Bed  - 2 x daily - 7 x weekly - 1 sets - 3 reps - 30 sec  hold - Hooklying Hamstring Stretch with Strap  - 2 x daily - 7 x weekly - 1 sets - 3 reps - 30 sec  hold - Standing Piriformis Release with Ball at [employer]  - 2 x daily - 7 x weekly - 30-60 sec  hold - Sidelying ITB Stretch  - 2 x daily - 7 x weekly - 1 sets - 3 reps - 30 sec  hold - Seated Hip Flexor Stretch  - 2 x daily - 7 x weekly - 1 sets - 3 reps - 30 sec  hold - Prone Quadriceps Stretch with Strap  - 2 x daily - 7 x weekly - 1 sets - 3 reps - 30 sec  hold - Isometric Gluteus Medius at Wall  - 1 x daily - 7 x weekly - 1-2 sets - 10 reps - 5 sec hold - Sidelying Hip Abduction  - 1 x daily - 7 x weekly - 3 sets - 10 reps - 3-5 sec  hold - Supine Bridge with Resistance Band  - 2 x daily - 7 x weekly - 1-2 sets - 10 reps - 5-10 sec  hold - Anti-Rotation Lateral Stepping with Press  - 2 x daily - 7 x weekly - 1-2 sets - 10 reps - 2-3 sec  hold - Supine Figure 4 Piriformis Stretch  - 1 x daily - 7 x weekly - 1 sets - 1 reps - 3-5 min  hold - Clamshell with Resistance  - 1 x daily - 7 x weekly - 1 sets - 10 reps - 3 sec  hold - Seated Hip Internal Rotation with Ball and Resistance  - 2 x daily - 7 x weekly - 1 sets - 10 reps - 3 sec  hold - Side Stepping with Resistance at Thighs  - 1 x daily - 7 x weekly - Seated Anterior Pelvic Tilt  - 2 x daily - 7 x weekly - 1 sets - 10 reps - 2-3 sec  hold - Doorway Pec Stretch at 60 Degrees Abduction  - 3 x daily - 7 x weekly - 1 sets - 3 reps - Doorway Pec Stretch at 90  Degrees Abduction  - 3 x daily - 7 x weekly - 1 sets - 3 reps - 30 seconds  hold - Doorway Pec Stretch at 120 Degrees Abduction  - 3 x daily - 7 x weekly - 1 sets - 3 reps - 30 second hold  hold - Seated Scapular Retraction  - 2 x daily - 7 x weekly - 1-2 sets - 10 reps - 10 sec  hold - Shoulder External Rotation and Scapular Retraction with Resistance  - 2 x daily - 7 x weekly - 1 sets - 10 reps - 3-5 sec  hold - Shoulder W - External Rotation with Resistance  - 2 x daily - 7 x weekly - 1-2 sets - 10 reps - 3 sec  hold - Hooklying Shoulder T  - 2 x daily - 7 x weekly - 1 sets - 1 reps - 2-5 min  hold - Supine Transversus Abdominis Bracing with Pelvic Floor Contraction  - 2 x daily - 7 x weekly - 1 sets - 10 reps - 10sec  hold  Patient Education - Office Posture  ASSESSMENT:  CLINICAL  IMPRESSION: Excellent progress. Patient is independent in HEP and has returned to all normal functional activities including exercise routines. Goals of therapy have been accomplished. Patient will call with any questions or problems.    Eval: Patient is a 53 y.o. female who was seen today for physical therapy evaluation and treatment for torn L acetabular labrum. She presents with history of L hip pain for the past two years with increased pain in the past two months. Patient has asymmetrical posture with L hemipelvis higher than R in standing; tightness in ROM L ER; weakness L hip compared to R; muscular tightness as noted above; pain with functional activities. Patient will benefit from skilled PT to address the problems identified.    OBJECTIVE IMPAIRMENTS: decreased activity tolerance, decreased ROM, decreased strength, impaired flexibility, improper body mechanics, postural dysfunction, and pain.    GOALS: Goals reviewed with patient? Yes  SHORT TERM GOALS: Target date: 11/01/2023  Independent in initial HEP  Baseline: Goal status: met  2.  Increase mobility with decreased pain in L hip rotation and  extension allowing patient to perform exercises without difficulty  Baseline:  Goal status: met  3.  Increase strength L hip abduction and extension to 5/5  Baseline:  Goal status: met   LONG TERM GOALS: Target date: 11/29/2023  Patient reports decrease in L hip pain by 75-90% allowing her to perform 30-40 min of walking or exercise without increase in symptoms  Baseline:  Goal status: met  2.  Patient reports ability to sit for 1-2 hours without increase in pain when she stands up from sitting  Baseline:  Goal status: met  3.  Patient reports ability to stand and walk for 30-45 min without increase in pain from baseline  Baseline:  Goal status: met  4.  Patient demonstrates ability to stand on L LE for 10 + sec without loss of balance  Baseline:  Goal status: met  5.  IMPROVE PSFS: THE PATIENT SPECIFIC FUNCTIONAL SCALE SCORE BY 2 POINTS  Place score of 0-10 (0 = unable to perform activity and 10 = able to perform activity at the same level as before injury or problem)  Activity Date: 10/04/23 11/08/23   Walking for exercise  0 10   2.Standing > 5-10 min  5 10   3.Lower body workout in gym 0 10   4. Stand on L LE (SLS) 0 10   Total Score 5 40     Total Score = Sum of activity scores/number of activities 5/4 = 1.25  11/08/23: 40/4 = 10  Minimally Detectable Change: 3 points (for single activity); 2 points (for average score) Baseline:  Goal status: met  6.  Independent in advanced HEP including aquatic program as indicated  Baseline:  Goal status: met   PLAN:  PT FREQUENCY: 2x/week  PT DURATION: 8 weeks  PLANNED INTERVENTIONS: 97164- PT Re-evaluation, 97110-Therapeutic exercises, 97530- Therapeutic activity, 97112- Neuromuscular re-education, 97535- Self Care, 02859- Manual therapy, [phone]- Gait training, [phone]- Aquatic Therapy, Patient/Family education, Balance training, Stair training, Taping, and Joint mobilization  PLAN FOR NEXT SESSION: d/c to independent  HEP    Monica Mcdaniel Monica Mcdaniel, PT 11/08/2023, 8:17 AM

## 2023-11-14 ENCOUNTER — Ambulatory Visit (HOSPITAL_BASED_OUTPATIENT_CLINIC_OR_DEPARTMENT_OTHER): Admitting: Physical Therapy

## 2023-11-15 ENCOUNTER — Encounter: Admitting: Rehabilitative and Restorative Service Providers"

## 2023-11-16 ENCOUNTER — Ambulatory Visit (HOSPITAL_BASED_OUTPATIENT_CLINIC_OR_DEPARTMENT_OTHER): Admitting: Physical Therapy

## 2023-11-22 ENCOUNTER — Encounter: Admitting: Rehabilitative and Restorative Service Providers"

## 2023-11-23 ENCOUNTER — Ambulatory Visit (HOSPITAL_BASED_OUTPATIENT_CLINIC_OR_DEPARTMENT_OTHER): Admitting: Physical Therapy

## 2023-12-05 ENCOUNTER — Telehealth: Payer: Self-pay

## 2023-12-05 NOTE — Telephone Encounter (Signed)
 I called patient to see if she's available for surgery w/ Dr. Erik on 12/19/23 @[facility] at 1 pm. I left a voicemail requesting patient call me back to schedule.

## 2023-12-06 ENCOUNTER — Telehealth: Payer: Self-pay | Admitting: *Deleted

## 2023-12-06 ENCOUNTER — Other Ambulatory Visit: Payer: Self-pay | Admitting: Obstetrics and Gynecology

## 2023-12-06 NOTE — Telephone Encounter (Signed)
 Left patient a detailed message about scheduled virtual Pre OP appointment.

## 2023-12-06 NOTE — Progress Notes (Signed)
OR orders entered 

## 2023-12-07 ENCOUNTER — Telehealth: Admitting: Obstetrics and Gynecology

## 2023-12-07 ENCOUNTER — Encounter: Payer: Self-pay | Admitting: Obstetrics and Gynecology

## 2023-12-07 DIAGNOSIS — N950 Postmenopausal bleeding: Secondary | ICD-10-CM

## 2023-12-07 DIAGNOSIS — N840 Polyp of corpus uteri: Secondary | ICD-10-CM | POA: Diagnosis not present

## 2023-12-07 NOTE — H&P (View-Only) (Signed)
 TELEHEALTH GYNECOLOGY VISIT ENCOUNTER NOTE  Provider location: [facility] [place]   Patient location: Home  I connected with Monica Mcdaniel on 12/12/23 at  9:10 AM EDT by  MyChart audiovisual encounter    History:  Monica Mcdaniel is a 53 y.o. [id number] presenting for pre op appointment  Episode of vaginal bleeding x 5-7d that started 6 months after starting hormone therapy. Did have to take off her estrogen patch for 1 day about a week prior. Had cramping and loose stools with the bleeding. Symptoms resolved spontaneously.   We got a pelvic US  which showed a possible polyp and a 5mm EL. Discussed EMB versus hysteroscopy D&C and she opts for hysteroscopy D&C.   History is notable for T2DM with most recent A1c 4.7. She is taking semaglutide      Past Medical History:  Diagnosis Date   Anxiety    2010 - no current issues   GERD (gastroesophageal reflux disease)    no issues since weight loss   Heart murmur    functional heart murmur--as a child   Hypertension    Iron deficiency anemia 07/22/2019   Type 2 diabetes mellitus (HCC)    On ozempic , most recent A1c 4.7 2025   Past Surgical History:  Procedure Laterality Date   CHOLECYSTECTOMY     KNEE SURGERY Left    removal of benign tumor   REFRACTIVE SURGERY     TUBAL LIGATION     Current Outpatient Medications on File Prior to Visit  Medication Sig Dispense Refill   celecoxib  (CELEBREX ) 200 MG capsule Take 1 capsule (200 mg total) by mouth 2 (two) times daily with a meal. One to 2 tablets by mouth daily as needed for pain. 60 capsule 2   estradiol  (CLIMARA ) 0.025 mg/24hr patch Place 1 patch (0.025 mg total) onto the skin once a week. 4 patch 12   progesterone  (PROMETRIUM ) 100 MG capsule Take 1 capsule (100 mg total) by mouth daily. 30 capsule 12   Semaglutide , 1 MG/DOSE, (OZEMPIC , 1 MG/DOSE,) 4 MG/3ML SOPN Inject 1 mg into the skin once a week. 3 mL 6   VITAMIN D  PO Take by mouth daily.     No current  facility-administered medications on file prior to visit.   No Known Allergies OB History     Gravida  7   Para  3   Term  3   Preterm      AB  4   Living  3      SAB  1   IAB      Ectopic      Multiple      Live Births  3          Social History   Socioeconomic History   Marital status: Married    Spouse name: Not on file   Number of children: 3   Years of education: Not on file   Highest education level: Bachelor's degree (e.g., BA, AB, BS)  Occupational History   Not on file  Tobacco Use   Smoking status: Former    Current packs/day: 0.00    Average packs/day: 1 pack/day for 7.0 years (7.0 ttl pk-yrs)    Types: Cigarettes    Start date: 78    Quit date: 81    Years since quitting: 28.8   Smokeless tobacco: Former  Building Services Engineer status: Never Used  Substance and Sexual Activity   Alcohol use: Not Currently  Alcohol/week: 2.0 standard drinks of alcohol    Types: 2 Standard drinks or equivalent per week   Drug use: Never   Sexual activity: Yes    Partners: Male    Birth control/protection: Surgical  Other Topics Concern   Not on file  Social History Narrative   Not on file   Social Drivers of Health   Financial Resource Strain: Low Risk  (08/29/2023)   Overall Financial Resource Strain (CARDIA)    Difficulty of Paying Living Expenses: Not hard at all  Food Insecurity: No Food Insecurity (08/29/2023)   Hunger Vital Sign    Worried About Running Out of Food in the Last Year: Never true    Ran Out of Food in the Last Year: Never true  Transportation Needs: No Transportation Needs (08/29/2023)   PRAPARE - Administrator, Civil Service (Medical): No    Lack of Transportation (Non-Medical): No  Physical Activity: Sufficiently Active (08/29/2023)   Exercise Vital Sign    Days of Exercise per Week: 6 days    Minutes of Exercise per Session: 50 min  Stress: No Stress Concern Present (08/29/2023)   [employer] of  Occupational Health - Occupational Stress Questionnaire    Feeling of Stress: Only a little  Social Connections: Moderately Integrated (08/29/2023)   Social Connection and Isolation Panel    Frequency of Communication with Friends and Family: More than three times a week    Frequency of Social Gatherings with Friends and Family: Once a week    Attends Religious Services: Never    Database Administrator or Organizations: Yes    Attends Engineer, Structural: 1 to 4 times per year    Marital Status: Married  Catering Manager Violence: Not on file   The following portions of the patient's history were reviewed and updated as appropriate: allergies, current medications, past family history, past medical history, past social history, past surgical history and problem list.   Review of Systems:  Pertinent items noted in HPI and remainder of comprehensive ROS otherwise negative.  Physical Exam:   General:  Alert, oriented and cooperative.   Mental Status: Normal mood and affect perceived. Normal judgment and thought content.  Physical exam deferred due to nature of the encounter  Labs and Imaging No results found for this or any previous visit (from the past 2 weeks). No results found.    Assessment and Plan:   53yo with PMB/polyp presenting for pre op appt  Postmenopausal bleeding Endometrial polyp - Discussed polyp removal with hysteroscopy & polypectomy. Reviewed low risk of malignancy within the polyp and that removal will allow for complete pathologic evaluation. Polypectomy should also stop her bleeding.  - Risks of surgery include but are not limited to: bleeding, infection, uterine perforation with injury to bowel/bladder/blood vessels, fluid overload, electrolyte abnormalities, need for additional procedures, VTE, anesthesia reaction or medical complications like MI/CVA/death - We discussed postop restrictions, precautions and expectations - Preop testing per anesthesia -  She is aware she needs to stop semaglutide  1 week prior to her surgery date - All questions answered   I provided 16 minutes of non-face-to-face time during this encounter.  Kieth JAYSON Carolin, MD [facility] [employer], [facility]

## 2023-12-07 NOTE — Progress Notes (Signed)
 TELEHEALTH GYNECOLOGY VISIT ENCOUNTER NOTE  Provider location: [facility] for [facility] [place]   Patient location: Home  I connected with Monica Mcdaniel on 12/12/23 at  9:10 AM EDT by  MyChart audiovisual encounter    History:  Monica Mcdaniel is a 53 y.o. [id number] presenting for pre op appointment  Episode of vaginal bleeding x 5-7d that started 6 months after starting hormone therapy. Did have to take off her estrogen patch for 1 day about a week prior. Had cramping and loose stools with the bleeding. Symptoms resolved spontaneously.   We got a pelvic US  which showed a possible polyp and a 5mm EL. Discussed EMB versus hysteroscopy D&C and she opts for hysteroscopy D&C.   History is notable for T2DM with most recent A1c 4.7. She is taking semaglutide      Past Medical History:  Diagnosis Date   Anxiety    2010 - no current issues   GERD (gastroesophageal reflux disease)    no issues since weight loss   Heart murmur    functional heart murmur--as a child   Hypertension    Iron deficiency anemia 07/22/2019   Type 2 diabetes mellitus (HCC)    On ozempic , most recent A1c 4.7 2025   Past Surgical History:  Procedure Laterality Date   CHOLECYSTECTOMY     KNEE SURGERY Left    removal of benign tumor   REFRACTIVE SURGERY     TUBAL LIGATION     Current Outpatient Medications on File Prior to Visit  Medication Sig Dispense Refill   celecoxib  (CELEBREX ) 200 MG capsule Take 1 capsule (200 mg total) by mouth 2 (two) times daily with a meal. One to 2 tablets by mouth daily as needed for pain. 60 capsule 2   estradiol  (CLIMARA ) 0.025 mg/24hr patch Place 1 patch (0.025 mg total) onto the skin once a week. 4 patch 12   progesterone  (PROMETRIUM ) 100 MG capsule Take 1 capsule (100 mg total) by mouth daily. 30 capsule 12   Semaglutide , 1 MG/DOSE, (OZEMPIC , 1 MG/DOSE,) 4 MG/3ML SOPN Inject 1 mg into the skin once a week. 3 mL 6   VITAMIN D  PO Take by mouth daily.     No current  facility-administered medications on file prior to visit.   No Known Allergies OB History     Gravida  7   Para  3   Term  3   Preterm      AB  4   Living  3      SAB  1   IAB      Ectopic      Multiple      Live Births  3          Social History   Socioeconomic History   Marital status: Married    Spouse name: Not on file   Number of children: 3   Years of education: Not on file   Highest education level: Bachelor's degree (e.g., BA, AB, BS)  Occupational History   Not on file  Tobacco Use   Smoking status: Former    Current packs/day: 0.00    Average packs/day: 1 pack/day for 7.0 years (7.0 ttl pk-yrs)    Types: Cigarettes    Start date: 78    Quit date: 81    Years since quitting: 28.8   Smokeless tobacco: Former  Building Services Engineer status: Never Used  Substance and Sexual Activity   Alcohol use: Not Currently  Alcohol/week: 2.0 standard drinks of alcohol    Types: 2 Standard drinks or equivalent per week   Drug use: Never   Sexual activity: Yes    Partners: Male    Birth control/protection: Surgical  Other Topics Concern   Not on file  Social History Narrative   Not on file   Social Drivers of Health   Financial Resource Strain: Low Risk  (08/29/2023)   Overall Financial Resource Strain (CARDIA)    Difficulty of Paying Living Expenses: Not hard at all  Food Insecurity: No Food Insecurity (08/29/2023)   Hunger Vital Sign    Worried About Running Out of Food in the Last Year: Never true    Ran Out of Food in the Last Year: Never true  Transportation Needs: No Transportation Needs (08/29/2023)   PRAPARE - Administrator, Civil Service (Medical): No    Lack of Transportation (Non-Medical): No  Physical Activity: Sufficiently Active (08/29/2023)   Exercise Vital Sign    Days of Exercise per Week: 6 days    Minutes of Exercise per Session: 50 min  Stress: No Stress Concern Present (08/29/2023)   [employer] of  Occupational Health - Occupational Stress Questionnaire    Feeling of Stress: Only a little  Social Connections: Moderately Integrated (08/29/2023)   Social Connection and Isolation Panel    Frequency of Communication with Friends and Family: More than three times a week    Frequency of Social Gatherings with Friends and Family: Once a week    Attends Religious Services: Never    Database Administrator or Organizations: Yes    Attends Engineer, Structural: 1 to 4 times per year    Marital Status: Married  Catering Manager Violence: Not on file   The following portions of the patient's history were reviewed and updated as appropriate: allergies, current medications, past family history, past medical history, past social history, past surgical history and problem list.   Review of Systems:  Pertinent items noted in HPI and remainder of comprehensive ROS otherwise negative.  Physical Exam:   General:  Alert, oriented and cooperative.   Mental Status: Normal mood and affect perceived. Normal judgment and thought content.  Physical exam deferred due to nature of the encounter  Labs and Imaging No results found for this or any previous visit (from the past 2 weeks). No results found.    Assessment and Plan:   53yo with PMB/polyp presenting for pre op appt  Postmenopausal bleeding Endometrial polyp - Discussed polyp removal with hysteroscopy & polypectomy. Reviewed low risk of malignancy within the polyp and that removal will allow for complete pathologic evaluation. Polypectomy should also stop her bleeding.  - Risks of surgery include but are not limited to: bleeding, infection, uterine perforation with injury to bowel/bladder/blood vessels, fluid overload, electrolyte abnormalities, need for additional procedures, VTE, anesthesia reaction or medical complications like MI/CVA/death - We discussed postop restrictions, precautions and expectations - Preop testing per anesthesia -  She is aware she needs to stop semaglutide  1 week prior to her surgery date - All questions answered   I provided 16 minutes of non-face-to-face time during this encounter.  Kieth JAYSON Carolin, MD Center for [employer], [facility]

## 2023-12-14 ENCOUNTER — Encounter (HOSPITAL_COMMUNITY): Payer: Self-pay

## 2023-12-14 ENCOUNTER — Encounter (HOSPITAL_COMMUNITY): Payer: Self-pay | Admitting: Obstetrics and Gynecology

## 2023-12-14 NOTE — Progress Notes (Signed)
 Spoke w/ via phone for pre-op interview--- [facility] needs dos---- A1C, CBG, BMP and EKG per anesthesia.        Lab results------ COVID test -----patient states asymptomatic no test needed Arrive at -------1115 NPO after MN NO Solid Food.  Clear liquids from MN until---1015 Pre-Surgery Ensure or G2:  Med rec completed Medications to take morning of surgery -----NONE Diabetic medication -----  GLP1 agonist last dose: Ozempic , last dose 12/04/23.  GLP1 instructions: No further doses until after surgery, pt verbalized understanding.  Patient instructed no nail polish to be worn day of surgery Patient instructed to bring photo id and insurance card day of surgery Patient aware to have Driver (ride ) / caregiver    for 24 hours after surgery - Husband Elsie Ali Patient Special Instructions ----- Pre-Op special Instructions -----  Patient verbalized understanding of instructions that were given at this phone interview. Patient denies chest pain, sob, fever, cough at the interview.

## 2023-12-19 ENCOUNTER — Ambulatory Visit (HOSPITAL_COMMUNITY): Admitting: Certified Registered Nurse Anesthetist

## 2023-12-19 ENCOUNTER — Ambulatory Visit (HOSPITAL_COMMUNITY)
Admission: RE | Admit: 2023-12-19 | Discharge: 2023-12-19 | Disposition: A | Discharge status: Home / Self Care | Attending: Obstetrics and Gynecology | Admitting: Obstetrics and Gynecology

## 2023-12-19 ENCOUNTER — Encounter (HOSPITAL_COMMUNITY)
Admission: RE | Disposition: A | Discharge status: Home / Self Care | Payer: Self-pay | Source: Home / Self Care | Attending: Obstetrics and Gynecology

## 2023-12-19 ENCOUNTER — Encounter (HOSPITAL_COMMUNITY): Payer: Self-pay | Admitting: Obstetrics and Gynecology

## 2023-12-19 ENCOUNTER — Other Ambulatory Visit: Payer: Self-pay | Admitting: Urgent Care

## 2023-12-19 DIAGNOSIS — Z7985 Long-term (current) use of injectable non-insulin antidiabetic drugs: Secondary | ICD-10-CM | POA: Diagnosis not present

## 2023-12-19 DIAGNOSIS — K219 Gastro-esophageal reflux disease without esophagitis: Secondary | ICD-10-CM | POA: Insufficient documentation

## 2023-12-19 DIAGNOSIS — N858 Other specified noninflammatory disorders of uterus: Secondary | ICD-10-CM | POA: Diagnosis not present

## 2023-12-19 DIAGNOSIS — N840 Polyp of corpus uteri: Secondary | ICD-10-CM | POA: Insufficient documentation

## 2023-12-19 DIAGNOSIS — E1165 Type 2 diabetes mellitus with hyperglycemia: Secondary | ICD-10-CM

## 2023-12-19 DIAGNOSIS — E119 Type 2 diabetes mellitus without complications: Secondary | ICD-10-CM | POA: Insufficient documentation

## 2023-12-19 DIAGNOSIS — I10 Essential (primary) hypertension: Secondary | ICD-10-CM | POA: Insufficient documentation

## 2023-12-19 DIAGNOSIS — Z87891 Personal history of nicotine dependence: Secondary | ICD-10-CM | POA: Insufficient documentation

## 2023-12-19 DIAGNOSIS — N950 Postmenopausal bleeding: Secondary | ICD-10-CM | POA: Diagnosis not present

## 2023-12-19 HISTORY — PX: HYSTEROSCOPY WITH D & C: SHX1775

## 2023-12-19 LAB — GLUCOSE, CAPILLARY
Glucose-Capillary: 69 mg/dL — ABNORMAL LOW (ref 70–99)
Glucose-Capillary: 82 mg/dL (ref 70–99)

## 2023-12-19 LAB — HEMOGLOBIN A1C
Hgb A1c MFr Bld: 4.1 % — ABNORMAL LOW (ref 4.8–5.6)
Mean Plasma Glucose: 70.97 mg/dL

## 2023-12-19 SURGERY — DILATATION AND CURETTAGE /HYSTEROSCOPY
Anesthesia: General | Site: Uterus

## 2023-12-19 MED ORDER — ACETAMINOPHEN 10 MG/ML IV SOLN: 1000.0000 mg | Freq: Once | INTRAVENOUS | Status: DC | PRN

## 2023-12-19 MED ORDER — CHLORHEXIDINE GLUCONATE 0.12 % MT SOLN
15.0000 mL | Freq: Once | OROMUCOSAL | Status: AC
  Administered 2023-12-19: 15 mL via OROMUCOSAL

## 2023-12-19 MED ORDER — MIDAZOLAM HCL (PF) 2 MG/2ML IJ SOLN
INTRAMUSCULAR | Status: DC | PRN
Start: 2023-12-19 — End: 2023-12-19
  Administered 2023-12-19: 2 mg via INTRAVENOUS

## 2023-12-19 MED ORDER — DROPERIDOL 2.5 MG/ML IJ SOLN
0.6250 mg | Freq: Once | INTRAMUSCULAR | Status: DC | PRN
Start: 2023-12-19 — End: 2023-12-19

## 2023-12-19 MED ORDER — MIDAZOLAM HCL 2 MG/2ML IJ SOLN
INTRAMUSCULAR | Status: AC
  Filled 2023-12-19: qty 2

## 2023-12-19 MED ORDER — ONDANSETRON HCL 4 MG/2ML IJ SOLN
INTRAMUSCULAR | Status: DC | PRN
Start: 2023-12-19 — End: 2023-12-19
  Administered 2023-12-19: 4 mg via INTRAVENOUS

## 2023-12-19 MED ORDER — SODIUM CHLORIDE 0.9 % IR SOLN
Status: DC | PRN
  Administered 2023-12-19: 3000 mL

## 2023-12-19 MED ORDER — FENTANYL CITRATE (PF) 250 MCG/5ML IJ SOLN
INTRAMUSCULAR | Status: DC | PRN
  Administered 2023-12-19 (×2): 50 ug via INTRAVENOUS

## 2023-12-19 MED ORDER — OXYCODONE HCL 5 MG/5ML PO SOLN: 5.0000 mg | Freq: Once | ORAL | Status: DC | PRN

## 2023-12-19 MED ORDER — CHLORHEXIDINE GLUCONATE 0.12 % MT SOLN
OROMUCOSAL | Status: DC
Start: 2023-12-19 — End: 2023-12-19
  Filled 2023-12-19: qty 15

## 2023-12-19 MED ORDER — LIDOCAINE 2% (20 MG/ML) 5 ML SYRINGE
INTRAMUSCULAR | Status: DC | PRN
  Administered 2023-12-19: 40 mg via INTRAVENOUS

## 2023-12-19 MED ORDER — FENTANYL CITRATE (PF) 100 MCG/2ML IJ SOLN
INTRAMUSCULAR | Status: AC
  Filled 2023-12-19: qty 2

## 2023-12-19 MED ORDER — ORAL CARE MOUTH RINSE: 15.0000 mL | Freq: Once | OROMUCOSAL | Status: AC

## 2023-12-19 MED ORDER — LIDOCAINE 2% (20 MG/ML) 5 ML SYRINGE
INTRAMUSCULAR | Status: AC
  Filled 2023-12-19: qty 5

## 2023-12-19 MED ORDER — DEXMEDETOMIDINE HCL IN NACL 80 MCG/20ML IV SOLN
INTRAVENOUS | Status: DC | PRN
  Administered 2023-12-19 (×2): 8 ug via INTRAVENOUS

## 2023-12-19 MED ORDER — PROPOFOL 10 MG/ML IV BOLUS
INTRAVENOUS | Status: DC | PRN
  Administered 2023-12-19: 200 mg via INTRAVENOUS

## 2023-12-19 MED ORDER — OXYCODONE HCL 5 MG PO TABS: 5.0000 mg | ORAL_TABLET | Freq: Once | ORAL | Status: DC | PRN

## 2023-12-19 MED ORDER — PROPOFOL 10 MG/ML IV BOLUS
INTRAVENOUS | Status: AC
  Filled 2023-12-19: qty 20

## 2023-12-19 MED ORDER — DEXAMETHASONE SOD PHOSPHATE PF 10 MG/ML IJ SOLN
INTRAMUSCULAR | Status: DC | PRN
Start: 2023-12-19 — End: 2023-12-19
  Administered 2023-12-19: 5 mg via INTRAVENOUS

## 2023-12-19 MED ORDER — ONDANSETRON HCL 4 MG/2ML IJ SOLN
INTRAMUSCULAR | Status: AC
  Filled 2023-12-19: qty 2

## 2023-12-19 MED ORDER — LACTATED RINGERS IV SOLN: INTRAVENOUS | Status: DC

## 2023-12-19 MED ORDER — FENTANYL CITRATE (PF) 100 MCG/2ML IJ SOLN: 25.0000 ug | INTRAMUSCULAR | Status: DC | PRN

## 2023-12-19 SURGICAL SUPPLY — 11 items
COVER MAYO STAND STRL (DRAPES) ×1 IMPLANT
ELECTRODE REM PT RTRN 9FT ADLT (ELECTROSURGICAL) IMPLANT
GLOVE BIO SURGEON STRL SZ7.5 (GLOVE) ×1 IMPLANT
GLOVE SURG UNDER POLY LF SZ7 (GLOVE) ×1 IMPLANT
GOWN STRL REUS W/ TWL LRG LVL3 (GOWN DISPOSABLE) ×1 IMPLANT
GOWN STRL REUS W/ TWL XL LVL3 (GOWN DISPOSABLE) ×1 IMPLANT
KIT PROCED FLUENT PRO FLT212S (KITS) ×1 IMPLANT
PACK VAGINAL MINOR WOMEN LF (CUSTOM PROCEDURE TRAY) ×1 IMPLANT
PAD OB MATERNITY 11 LF (PERSONAL CARE ITEMS) ×1 IMPLANT
SEAL ROD LENS SCOPE MYOSURE (ABLATOR) IMPLANT
TOWEL GREEN STERILE FF (TOWEL DISPOSABLE) ×2 IMPLANT

## 2023-12-19 NOTE — Interval H&P Note (Signed)
 History and Physical Interval Note:  12/19/2023 12:15 PM  Monica Mcdaniel  has presented today for surgery, with the diagnosis of PMB.  The various methods of treatment have been discussed with the patient and family. After consideration of risks, benefits and other options for treatment, the patient has consented to  Procedure(s) with comments: DILATATION AND CURETTAGE /HYSTEROSCOPY (N/A) - Hysteroscopy/Polyp resect/D&C as a surgical intervention.  The patient's history has been reviewed, patient examined, no change in status, stable for surgery.  I have reviewed the patient's chart and labs.  Questions were answered to the patient's satisfaction.     Kieth JAYSON Carolin

## 2023-12-19 NOTE — Anesthesia Preprocedure Evaluation (Addendum)
 Anesthesia Evaluation  Patient identified by MRN, date of birth, ID band Patient awake    Reviewed: Allergy & Precautions, NPO status , Patient's Chart, lab work & pertinent test results  Airway Mallampati: I  TM Distance: >3 FB Neck ROM: Full    Dental  (+) Teeth Intact, Dental Advisory Given   Pulmonary former smoker   breath sounds clear to auscultation       Cardiovascular hypertension,  Rhythm:Regular Rate:Normal     Neuro/Psych   Anxiety     negative neurological ROS     GI/Hepatic Neg liver ROS,GERD  ,,  Endo/Other  diabetes, Type 2    Renal/GU negative Renal ROS     Musculoskeletal negative musculoskeletal ROS (+)    Abdominal   Peds  Hematology  (+) Blood dyscrasia, anemia   Anesthesia Other Findings   Reproductive/Obstetrics                              Anesthesia Physical Anesthesia Plan  ASA: 2  Anesthesia Plan: General   Post-op Pain Management: Tylenol  PO (pre-op)* and Toradol IV (intra-op)*   Induction: Intravenous  PONV Risk Score and Plan: 4 or greater and Ondansetron, Dexamethasone , Midazolam and Scopolamine patch - Pre-op  Airway Management Planned: LMA  Additional Equipment: None  Intra-op Plan:   Post-operative Plan: Extubation in OR  Informed Consent: I have reviewed the patients History and Physical, chart, labs and discussed the procedure including the risks, benefits and alternatives for the proposed anesthesia with the patient or authorized representative who has indicated his/her understanding and acceptance.     Dental advisory given  Plan Discussed with: CRNA  Anesthesia Plan Comments:          Anesthesia Quick Evaluation

## 2023-12-19 NOTE — Anesthesia Procedure Notes (Signed)
 Procedure Name: LMA Insertion Date/Time: 12/19/2023 12:43 PM  Performed by: Jolynn Mage, CRNAPre-anesthesia Checklist: Patient identified, Emergency Drugs available, Suction available and Patient being monitored Patient Re-evaluated:Patient Re-evaluated prior to induction Oxygen Delivery Method: Circle system utilized Preoxygenation: Pre-oxygenation with 100% oxygen Induction Type: IV induction Ventilation: Mask ventilation without difficulty LMA: LMA flexible inserted LMA Size: 4.0 Number of attempts: 1 Placement Confirmation: positive ETCO2 and breath sounds checked- equal and bilateral Tube secured with: Tape Dental Injury: Teeth and Oropharynx as per pre-operative assessment

## 2023-12-19 NOTE — Transfer of Care (Signed)
 Immediate Anesthesia Transfer of Care Note  Patient: Monica Mcdaniel  Procedure(s) Performed: DILATATION AND CURETTAGE /HYSTEROSCOPY (Uterus)  Patient Location: PACU  Anesthesia Type:General  Level of Consciousness: awake, alert , oriented, patient cooperative, and responds to stimulation  Airway & Oxygen Therapy: Patient Spontanous Breathing and Patient connected to face mask oxygen  Post-op Assessment: Report given to RN, Post -op Vital signs reviewed and stable, and Patient moving all extremities X 4  Post vital signs: Reviewed and stable  Last Vitals:  Vitals Value Taken Time  BP    Temp    Pulse 72 12/19/23 13:12  Resp 13 12/19/23 13:12  SpO2 100 % 12/19/23 13:12  Vitals shown include unfiled device data.  Last Pain:  Vitals:   12/19/23 1145  TempSrc: Oral  PainSc: 0-No pain      Patients Stated Pain Goal: 5 (12/19/23 1145)  Complications: No notable events documented.

## 2023-12-19 NOTE — Discharge Instructions (Addendum)
 Post-surgical Instructions, Outpatient Surgery  Surgery went well today. There were NO polyps or fibroids. The lining of the uterus was very thin which can also cause bleeding and is what I expected to find.   You may expect to feel dizzy, weak, and drowsy for as long as 24 hours after receiving the medicine that made you sleep (anesthetic). For the first 24 hours after your surgery:   Do not drive a car, ride a bicycle, participate in physical activities, or take public transportation Do not drink alcohol or take tranquilizers.  Do not take medicine that has not been prescribed by your physicians.  Do not sign important papers or make important decisions while on narcotic pain medicines.  Have a responsible person with you.   PAIN MANAGEMENT Ibuprofen 600mg .  (This is the same as 3-200mg  over the counter tablets of Motrin or ibuprofen.)  Take this every 6 hours or as needed for cramping.  Ibuprofen and celebrex  need to be taken at least 12 hours apart if you are going to take both.  Acetaminophen  1000mg  (This is the same as 2-500mg  over the counter extra strength tylenol ). Take this every 6 hours as needed afterwards for pain  DO'S AND DON'T'S Do not take a tub bath for 1 week.  You may shower  Do move around as you feel able.  Stairs are fine.  You may begin to exercise again as you feel able.  Do not put anything in the vagina for two weeks--no tampons, intercourse, or douching.    REGULAR MEDIATIONS/VITAMINS: You may restart all of your regular medications as prescribed. You may restart all of your vitamins as you normally take them.    PLEASE CALL OR SEEK MEDICAL CARE IF: You have persistent nausea and vomiting.  You have trouble eating or drinking.  You have an oral temperature above 100.5.  You have constipation that is not helped by adjusting diet or increasing fluid intake. Pain medicines are a common cause of constipation.  You have heavy vaginal bleeding

## 2023-12-19 NOTE — Op Note (Signed)
 12/19/23 1:05 PM  Preoperative Diagnosis: Postmenopausal bleeding Postoperative Diagnosis: Same Procedure: Hysteroscopy dilation & curettage  Surgeon: Dr. Kieth Carolin Assistant: None EBL 5 cc IVF: 600 cc  UOP: Not collected Deficit: 150cc  Specimens: Endometrial curetting  Findings: Thin atrophic appearing endometrium. Normal uterus/cervix.   Description of the procedure: Preop antibiotics not indicated. Informed consent reviewed and signed. Pt given opportunity to ask questions.   Pt prepped and draped in the dorsal lithotomy fashion after LMA anesthesia found to be adequate. Timeout performed.   Open sided speculum placed into the patient's vagina. Single tooth tenaculum applied to the 12 o'clock position of the cervix.  Cervix progressively dilated to a 17 French. 30 degree hysteroscope was inserted into the cavity with the aforementioned findings noted. Sharp curettage performed and EMC collected.   Hemostatic at the end of the procedure. Procedure completed. All instruments removed. Counts correct x2.  Pt taken to recovery room in stable condition.  Kieth Carolin, MD Obstetrician & Gynecologist, [facility] for [employer], [facility]

## 2023-12-19 NOTE — Anesthesia Postprocedure Evaluation (Signed)
 Anesthesia Post Note  Patient: Monica Mcdaniel  Procedure(s) Performed: DILATATION AND CURETTAGE /HYSTEROSCOPY (Uterus)     Patient location during evaluation: PACU Anesthesia Type: General Level of consciousness: awake and alert Pain management: pain level controlled Vital Signs Assessment: post-procedure vital signs reviewed and stable Respiratory status: spontaneous breathing, nonlabored ventilation, respiratory function stable and patient connected to nasal cannula oxygen Cardiovascular status: blood pressure returned to baseline and stable Postop Assessment: no apparent nausea or vomiting Anesthetic complications: no   No notable events documented.  Last Vitals:  Vitals:   12/19/23 1345 12/19/23 1353  BP: (!) 112/59 117/70  Pulse: 63 65  Resp: 14 15  Temp:    SpO2: 98% 99%    Last Pain:  Vitals:   12/19/23 1353  TempSrc:   PainSc: 0-No pain                 Franky JONETTA Bald

## 2023-12-20 ENCOUNTER — Encounter (HOSPITAL_COMMUNITY): Payer: Self-pay | Admitting: Obstetrics and Gynecology

## 2023-12-20 LAB — SURGICAL PATHOLOGY

## 2023-12-21 ENCOUNTER — Ambulatory Visit: Payer: Self-pay | Admitting: Obstetrics and Gynecology

## 2024-03-01 ENCOUNTER — Encounter: Payer: Self-pay | Admitting: Urgent Care

## 2024-03-01 ENCOUNTER — Ambulatory Visit: Admitting: Urgent Care

## 2024-03-01 VITALS — BP 121/68 | HR 64 | Ht 62.0 in | Wt 141.0 lb

## 2024-03-01 DIAGNOSIS — M778 Other enthesopathies, not elsewhere classified: Secondary | ICD-10-CM | POA: Diagnosis not present

## 2024-03-01 DIAGNOSIS — E1165 Type 2 diabetes mellitus with hyperglycemia: Secondary | ICD-10-CM

## 2024-03-01 DIAGNOSIS — Z7985 Long-term (current) use of injectable non-insulin antidiabetic drugs: Secondary | ICD-10-CM

## 2024-03-01 DIAGNOSIS — Z1211 Encounter for screening for malignant neoplasm of colon: Secondary | ICD-10-CM | POA: Diagnosis not present

## 2024-03-01 DIAGNOSIS — Z23 Encounter for immunization: Secondary | ICD-10-CM | POA: Diagnosis not present

## 2024-03-01 MED ORDER — DICLOFENAC SODIUM 75 MG PO TBEC: 75.0000 mg | DELAYED_RELEASE_TABLET | Freq: Two times a day (BID) | ORAL | 0 refills | Status: AC

## 2024-03-01 NOTE — Progress Notes (Signed)
 "  Established Patient Office Visit  Subjective:  Patient ID: Monica Mcdaniel, female    DOB: 06/30/1970  Age: 54 y.o. MRN: [id number]  Chief Complaint  Patient presents with   Diabetes    HPI  Discussed the use of AI scribe software for clinical note transcription with the patient, who gave verbal consent to proceed.  History of Present Illness   Monica Mcdaniel is a 54 year old female who presents with right shoulder pain.  She has experienced right shoulder pain since college, exacerbated by specific movements such as changing the radio station, bringing her arm back down after lifting it, and performing a 'chicken wing' motion. The pain extends to her tricep and is particularly severe at night, affecting her sleep as she cannot lie on her right side and requires a pillow to support her arm when lying on her left side.  She has undergone multiple x-rays in the past, with the last one approximately ten years ago indicating bursitis. Despite trying various exercises and stretches, the pain persists. She takes two Aleve twice a day, which provides some relief, and occasionally supplements with Tylenol  in the afternoon. However, she continues to experience significant pain and has been avoiding upper body workouts and heavy weight lifting.  The pain impacts her daily activities, such as putting on a coat or taking off a shirt, and she describes a lack of stability in her right arm, often relying more on her left arm. She mentions that the pain is 'way worse than my hip' and that she can hear creaking sounds when moving her shoulder.  She continues to take Ozempic , primarily for its effect on her mental state rather than hunger control, and is very active, participating in Pilates while modifying exercises to accommodate her shoulder pain. She has a history of diabetes, which requires annual foot and eye exams.      Patient Active Problem List   Diagnosis Date Noted   Postmenopausal bleeding 12/19/2023    Chronic left hip pain 09/19/2023   Right lateral epicondylitis 03/02/2023   Type 2 diabetes mellitus with hyperglycemia, without long-term current use of insulin (HCC) 05/26/2022   Morbid obesity (HCC) 11/24/2021   Iron deficiency anemia 07/22/2019   Gastroesophageal reflux disease 05/01/2018   Persistent cough 05/01/2018   Hyperglycemia 11/08/2017   Hypertension 10/10/2017   Past Medical History:  Diagnosis Date   Anxiety    2010 - no current issues   GERD (gastroesophageal reflux disease)    no issues since weight loss   Heart murmur    functional heart murmur--as a child   Hypertension    Iron deficiency anemia 07/22/2019   Type 2 diabetes mellitus (HCC)    On ozempic , most recent A1c 4.7 2025   Past Surgical History:  Procedure Laterality Date   CHOLECYSTECTOMY     HYSTEROSCOPY WITH D & C N/A 12/19/2023   Procedure: DILATATION AND CURETTAGE /HYSTEROSCOPY;  Surgeon: Erik Kieth BROCKS, MD;  Location: [facility] OR;  Service: Gynecology;  Laterality: N/A;  Hysteroscopy/Polyp resect/D&C   KNEE SURGERY Left    removal of benign tumor   REFRACTIVE SURGERY     TUBAL LIGATION     Social History[1]    ROS: as noted in HPI  Objective:     BP 121/68   Pulse 64   Ht 5' 2 (1.575 m)   Wt 141 lb (64 kg)   LMP  (LMP Unknown)   SpO2 100%   BMI 25.79 kg/m  BP  Readings from Last 3 Encounters:  03/01/24 121/68  12/19/23 117/70  10/11/23 137/80   Wt Readings from Last 3 Encounters:  03/01/24 141 lb (64 kg)  12/19/23 139 lb (63 kg)  10/11/23 141 lb (64 kg)      Physical Exam Vitals and nursing note reviewed. Exam conducted with a chaperone present.  Constitutional:      General: She is not in acute distress.    Appearance: Normal appearance. She is normal weight. She is not ill-appearing, toxic-appearing or diaphoretic.  HENT:     Head: Normocephalic and atraumatic.     Right Ear: Tympanic membrane, ear canal and external ear normal. There is no impacted cerumen.      Left Ear: Tympanic membrane, ear canal and external ear normal. There is no impacted cerumen.     Nose: Nose normal.     Mouth/Throat:     Mouth: Mucous membranes are moist.     Pharynx: Oropharynx is clear. No oropharyngeal exudate or posterior oropharyngeal erythema.  Eyes:     General: No scleral icterus.       Right eye: No discharge.        Left eye: No discharge.     Extraocular Movements: Extraocular movements intact.     Pupils: Pupils are equal, round, and reactive to light.  Neck:     Thyroid: No thyroid mass, thyromegaly or thyroid tenderness.  Cardiovascular:     Rate and Rhythm: Normal rate and regular rhythm.     Pulses: Normal pulses.     Heart sounds: No murmur heard. Pulmonary:     Effort: Pulmonary effort is normal. No respiratory distress.     Breath sounds: Normal breath sounds. No stridor. No wheezing or rhonchi.  Musculoskeletal:       Arms:     Cervical back: Normal range of motion and neck supple. No rigidity or tenderness.     Right lower leg: No edema.     Left lower leg: No edema.     Right foot: Normal range of motion.     Left foot: Normal range of motion.     Comments: Negative Obrien and sulcus sign Negative hawkins / neer Negative speeds/ yergason  Feet:     Right foot:     Skin integrity: Skin integrity normal. No ulcer, blister, skin breakdown or erythema.     Left foot:     Skin integrity: Skin integrity normal. No ulcer, blister, skin breakdown or erythema.  Lymphadenopathy:     Cervical: No cervical adenopathy.  Skin:    General: Skin is warm and dry.     Coloration: Skin is not jaundiced.     Findings: No bruising, erythema or rash.  Neurological:     General: No focal deficit present.     Mental Status: She is alert and oriented to person, place, and time.     Sensory: No sensory deficit.     Motor: No weakness.  Psychiatric:        Mood and Affect: Mood normal.        Behavior: Behavior normal.      No results found for any  visits on 03/01/24.  Last CBC Lab Results  Component Value Date   WBC 5.5 08/30/2023   HGB 15.4 08/30/2023   HCT 45.5 08/30/2023   MCV 98 (H) 08/30/2023   MCH 33.2 (H) 08/30/2023   RDW 11.8 08/30/2023   PLT 276 08/30/2023   Last metabolic panel Lab Results  Component Value Date   GLUCOSE 85 08/30/2023   NA 140 08/30/2023   K 4.8 08/30/2023   CL 102 08/30/2023   CO2 20 08/30/2023   BUN 17 08/30/2023   CREATININE 0.79 08/30/2023   EGFR 89 08/30/2023   CALCIUM 9.6 08/30/2023   PROT 6.9 08/30/2023   ALBUMIN 4.5 08/30/2023   LABGLOB 2.4 08/30/2023   BILITOT 0.6 08/30/2023   ALKPHOS 90 08/30/2023   AST 24 08/30/2023   ALT 18 08/30/2023   ANIONGAP 12 09/20/2020   Last lipids Lab Results  Component Value Date   CHOL 191 08/30/2023   HDL 85 08/30/2023   LDLCALC 93 08/30/2023   TRIG 70 08/30/2023   CHOLHDL 2.2 08/30/2023   Last hemoglobin A1c Lab Results  Component Value Date   HGBA1C 4.1 (L) 12/19/2023   Last thyroid functions Lab Results  Component Value Date   TSH 2.310 08/30/2023   Last vitamin D  Lab Results  Component Value Date   VD25OH 26.8 (L) 08/30/2023   Last vitamin B12 and Folate Lab Results  Component Value Date   VITAMINB12 600 10/08/2020      The 10-year ASCVD risk score (Arnett DK, et al., 2019) is: 1.7%  Assessment & Plan:  Type 2 diabetes mellitus with hyperglycemia, without long-term current use of insulin (HCC) -     Ambulatory referral to Ophthalmology -     Microalbumin / creatinine urine ratio -     [facility] Diabetes Foot Exam  Screening for colon cancer -     Cologuard  Encounter for immunization -     Flu vaccine trivalent PF, 6mos and older(Flulaval,Afluria,Fluarix,Fluzone)  Triceps tendonitis -     Diclofenac  Sodium; Take 1 tablet (75 mg total) by mouth 2 (two) times daily.  Dispense: 30 tablet; Refill: 0  Assessment and Plan    Right triceps tendinitis Chronic right shoulder pain with triceps involvement, likely  tendinitis. Negative O'Brien's test. Pain improved with NSAIDs, indicating inflammation. Differential includes tendinitis vs labral tear. - STOP Aleve (naproxen) 2 tablets twice daily; switch to diclofenac  BID. - Consider nitroglycerin patch for tendon blood flow if no response to NSAID alone - Consider physical therapy and specialist referral if symptoms persist. May need to consider MRI  Type 2 diabetes mellitus Well-controlled with A1c of 4.1. Continues Ozempic  for weight management and cognitive benefits. - Continue Ozempic . - Ensure annual foot and eye exams.  Colon cancer screening Due for screening, prefers non-invasive option. - Ordered Cologuard.  Immunization Due for flu vaccine.         Return in about 6 months (around 08/29/2024).   Benton LITTIE Gave, PA    [1]  Social History Tobacco Use   Smoking status: Former    Current packs/day: 0.00    Average packs/day: 1 pack/day for 7.0 years (7.0 ttl pk-yrs)    Types: Cigarettes    Start date: 67    Quit date: 1997    Years since quitting: 29.0   Smokeless tobacco: Former  Building Services Engineer status: Never Used  Substance Use Topics   Alcohol use: Not Currently    Alcohol/week: 2.0 standard drinks of alcohol    Types: 2 Standard drinks or equivalent per week   Drug use: Never   "

## 2024-03-01 NOTE — Patient Instructions (Signed)
 Stop aleve. Start diclofenac  twice daily with food x 14 days, Please perform the tricep rehab exercises attached to this form. If symptoms continue, we could try a nitroglycerin patch and formal PT.  Continue ozempic . Please bring back the urine sample for urine microalbumin.  We updated your flu vaccine today. Please consider also updating pneumonia.  I placed a referral for an eye doctor. Please complete the cologuard test.

## 2024-03-02 ENCOUNTER — Encounter: Payer: Self-pay | Admitting: Urgent Care

## 2024-03-02 LAB — MICROALBUMIN / CREATININE URINE RATIO
Creatinine, Urine: 82.8 mg/dL
Microalb/Creat Ratio: <4 mg/g{creat} (ref 0–29)
Microalbumin, Urine: <3 ug/mL

## 2024-03-03 ENCOUNTER — Ambulatory Visit: Payer: Self-pay | Admitting: Urgent Care

## 2024-03-03 DIAGNOSIS — M778 Other enthesopathies, not elsewhere classified: Secondary | ICD-10-CM

## 2024-03-14 MED ORDER — NITROGLYCERIN 0.2 MG/HR TD PT24: MEDICATED_PATCH | TRANSDERMAL | 11 refills | Status: AC

## 2024-03-19 ENCOUNTER — Ambulatory Visit

## 2024-03-19 VITALS — BP 120/70 | Ht 62.0 in | Wt 141.0 lb

## 2024-03-19 DIAGNOSIS — M25511 Pain in right shoulder: Secondary | ICD-10-CM | POA: Diagnosis not present

## 2024-03-19 DIAGNOSIS — G8929 Other chronic pain: Secondary | ICD-10-CM

## 2024-03-20 ENCOUNTER — Other Ambulatory Visit: Payer: Self-pay

## 2024-03-20 ENCOUNTER — Ambulatory Visit

## 2024-03-20 VITALS — BP 130/80 | Ht 62.0 in | Wt 141.0 lb

## 2024-03-20 DIAGNOSIS — S43431A Superior glenoid labrum lesion of right shoulder, initial encounter: Secondary | ICD-10-CM

## 2024-03-20 DIAGNOSIS — G8929 Other chronic pain: Secondary | ICD-10-CM

## 2024-03-20 DIAGNOSIS — M75101 Unspecified rotator cuff tear or rupture of right shoulder, not specified as traumatic: Secondary | ICD-10-CM

## 2024-03-20 DIAGNOSIS — S46811S Strain of other muscles, fascia and tendons at shoulder and upper arm level, right arm, sequela: Secondary | ICD-10-CM

## 2024-03-20 DIAGNOSIS — M25511 Pain in right shoulder: Secondary | ICD-10-CM

## 2024-03-20 DIAGNOSIS — M7521 Bicipital tendinitis, right shoulder: Secondary | ICD-10-CM

## 2024-03-20 MED ORDER — METHYLPREDNISOLONE ACETATE 40 MG/ML IJ SUSP
40.0000 mg | Freq: Once | INTRAMUSCULAR | Status: AC
  Administered 2024-03-20: 40 mg via INTRA_ARTICULAR

## 2024-03-20 NOTE — Progress Notes (Signed)
" ° °  Subjective:    Patient ID: Monica Mcdaniel, female    DOB: 54 y.o., [birth year]   MRN: [id number]  Chief Complaint:   History of Present Illness Monica Mcdaniel presents today for a full ultrasonographic evaluation of her right shoulder  Objective:   Vitals:   03/20/24 1603  BP: 130/80   Complete US  Examination of the Right Shoulder: -Biceps: Long head of the biceps is well-visualized in both long and short axis and appears to have a small fissure present within it with small volume peritendinous hypoechogenic fluid signal predominantly superficial and medial to the tendon. -Pectoralis Major: well visualized at its insertion onto the humerus and without abnormality. -Subscapularis: Well visualized at its attachment to the humerus, and appears normal without any signs of tear. Dynamic testing does not show subcoracoid impingement. -Supraspinatus: Well visualized at its attachment to the humerus, and appears to have an intrasubstance area of fiber disruption which extends toward the articular side.  Modest enlargement of the subacromial bursa. Dynamic testing does not show signs of subacromial impingement. -Infraspinatus: Well visualized and appears to have a small area of tendon architecture disruption near the footprint at the superiormost aspect. -Teres Minor: Well visualized and appears normal without any signs of tear. -Glenohumeral Joint: Posterior aspect of the GH joint visualized and does not show any signs of effusion.  Small portion of visible glenoid labrum which appears to have a hypoechogenic fissure contained therein. -Acromioclavicular Joint: Mildly arthritic appearing AC joint without significant separation, effusion, or signs of osteoarthritis.  Positive geyser sign  Impression: - Mild AC joint arthritis - Modest subacromial bursitis - Mild biceps tenosynovitis with minimal longitudinal split tearing - Partial-thickness tearing of the supraspinatus and infraspinatus tendons - Potential  tear of the superoposterior glenoid labrum  Ultrasound examination performed and interpreted by Monica Robes, DO  Right Subacromial Space Injection with Ultrasound Guidance Procedure Note Monica Mcdaniel December 31, 1970 Indications: Pain Procedure Details Verbal consent was obtained from the patient. Risks, benefits, and alternatives were explained. The Right subacromial space and subacromial/subdeltoid bursa was identified on US . Patient prepped with Chloraprep and Ethyl Chloride used for anesthesia. The patient was then injected using a lateral approach with a solution of 40mg  Depo-Medrol , 3cc Mepivacaine 2%, 0.5cc Sodium Bicarbonate 8.4%. The patient tolerated the procedure well and had decreased pain post injection. No complications. Please see applicable images in the medical record.     Assessment & Plan:   Assessment & Plan Ultrasound findings consistent with mild AC joint arthritis, moderate subacromial bursitis, mild biceps tenosynovitis with minimal longitudinal split tearing, partial-thickness tearing of the supraspinatus tendon and infraspinatus tendons, and potential tear of the superior posterior glenoid labrum.  Given this, discussed pain management strategies alongside physical therapy.  Patient ultimately elected for subacromial steroid injection which I provided today under ultrasound guidance.  PT referral sent to renew.  Plan for follow-up in 6 weeks or sooner as needed.   "

## 2024-03-21 LAB — COLOGUARD: COLOGUARD: NEGATIVE

## 2024-05-08 ENCOUNTER — Ambulatory Visit

## 2024-08-29 ENCOUNTER — Ambulatory Visit: Admitting: Urgent Care
